# Patient Record
Sex: Female | Born: 1965 | Race: White | Hispanic: No | Smoking: Current every day smoker
Health system: Southern US, Community
[De-identification: ages and names within clinical notes are randomized; demographics above are authoritative.]

## PROBLEM LIST (undated history)

## (undated) DIAGNOSIS — E119 Type 2 diabetes mellitus without complications: Secondary | ICD-10-CM

## (undated) DIAGNOSIS — I10 Essential (primary) hypertension: Secondary | ICD-10-CM

## (undated) HISTORY — PX: TONSILLECTOMY: SUR1361

## (undated) HISTORY — PX: NASAL SEPTUM SURGERY: SHX37

## (undated) HISTORY — PX: PARTIAL HYSTERECTOMY: SHX80

## (undated) HISTORY — PX: APPENDECTOMY: SHX54

## (undated) HISTORY — PX: ABDOMINAL HYSTERECTOMY: SHX81

## (undated) HISTORY — PX: CHOLECYSTECTOMY: SHX55

---

## 2015-08-19 ENCOUNTER — Emergency Department
Admission: EM | Admit: 2015-08-19 | Discharge: 2015-08-19 | Disposition: A | Discharge status: Home / Self Care | Payer: Self-pay | Attending: Emergency Medicine | Admitting: Emergency Medicine

## 2015-08-19 ENCOUNTER — Encounter: Payer: Self-pay | Admitting: Emergency Medicine

## 2015-08-19 DIAGNOSIS — E119 Type 2 diabetes mellitus without complications: Secondary | ICD-10-CM | POA: Insufficient documentation

## 2015-08-19 DIAGNOSIS — I10 Essential (primary) hypertension: Secondary | ICD-10-CM | POA: Insufficient documentation

## 2015-08-19 DIAGNOSIS — F17210 Nicotine dependence, cigarettes, uncomplicated: Secondary | ICD-10-CM | POA: Insufficient documentation

## 2015-08-19 DIAGNOSIS — H109 Unspecified conjunctivitis: Secondary | ICD-10-CM | POA: Insufficient documentation

## 2015-08-19 DIAGNOSIS — J301 Allergic rhinitis due to pollen: Secondary | ICD-10-CM | POA: Insufficient documentation

## 2015-08-19 DIAGNOSIS — Z760 Encounter for issue of repeat prescription: Secondary | ICD-10-CM | POA: Insufficient documentation

## 2015-08-19 HISTORY — DX: Essential (primary) hypertension: I10

## 2015-08-19 HISTORY — DX: Type 2 diabetes mellitus without complications: E11.9

## 2015-08-19 MED ORDER — POLYMYXIN B-TRIMETHOPRIM 10000-0.1 UNIT/ML-% OP SOLN: 1.0000 [drp] | Freq: Four times a day (QID) | OPHTHALMIC | Status: DC

## 2015-08-19 MED ORDER — LORATADINE 10 MG PO TABS: 10.0000 mg | ORAL_TABLET | Freq: Every day | ORAL | Status: DC

## 2015-08-19 MED ORDER — LISINOPRIL 10 MG PO TABS: 10.0000 mg | ORAL_TABLET | Freq: Every day | ORAL | Status: DC

## 2015-08-19 NOTE — ED Notes (Signed)
Pt requesting refill of 10mg  lisinopril. PA at bedside.

## 2015-08-19 NOTE — ED Notes (Signed)
Pt c/o redness, watery discharge, and "gunk in the morning" x 3 days. Pt presents with redness to both eyes at this time.

## 2015-08-19 NOTE — Discharge Instructions (Signed)
Allergic Rhinitis Allergic rhinitis is when the mucous membranes in the nose respond to allergens. Allergens are particles in the air that cause your body to have an allergic reaction. This causes you to release allergic antibodies. Through a chain of events, these eventually cause you to release histamine into the blood stream. Although meant to protect the body, it is this release of histamine that causes your discomfort, such as frequent sneezing, congestion, and an itchy, runny nose.  CAUSES Seasonal allergic rhinitis (hay fever) is caused by pollen allergens that may come from grasses, trees, and weeds. Year-round allergic rhinitis (perennial allergic rhinitis) is caused by allergens such as house dust mites, pet dander, and mold spores. SYMPTOMS  Nasal stuffiness (congestion).  Itchy, runny nose with sneezing and tearing of the eyes. DIAGNOSIS Your health care provider can help you determine the allergen or allergens that trigger your symptoms. If you and your health care provider are unable to determine the allergen, skin or blood testing may be used. Your health care provider will diagnose your condition after taking your health history and performing a physical exam. Your health care provider may assess you for other related conditions, such as asthma, pink eye, or an ear infection. TREATMENT Allergic rhinitis does not have a cure, but it can be controlled by:  Medicines that block allergy symptoms. These may include allergy shots, nasal sprays, and oral antihistamines.  Avoiding the allergen. Hay fever may often be treated with antihistamines in pill or nasal spray forms. Antihistamines block the effects of histamine. There are over-the-counter medicines that may help with nasal congestion and swelling around the eyes. Check with your health care provider before taking or giving this medicine. If avoiding the allergen or the medicine prescribed do not work, there are many new medicines  your health care provider can prescribe. Stronger medicine may be used if initial measures are ineffective. Desensitizing injections can be used if medicine and avoidance does not work. Desensitization is when a patient is given ongoing shots until the body becomes less sensitive to the allergen. Make sure you follow up with your health care provider if problems continue. HOME CARE INSTRUCTIONS It is not possible to completely avoid allergens, but you can reduce your symptoms by taking steps to limit your exposure to them. It helps to know exactly what you are allergic to so that you can avoid your specific triggers. SEEK MEDICAL CARE IF:  You have a fever.  You develop a cough that does not stop easily (persistent).  You have shortness of breath.  You start wheezing.  Symptoms interfere with normal daily activities.   This information is not intended to replace advice given to you by your health care provider. Make sure you discuss any questions you have with your health care provider.   Document Released: 01/01/2001 Document Revised: 04/29/2014 Document Reviewed: 12/14/2012 Elsevier Interactive Patient Education 2016 Elsevier Inc.  Bacterial Conjunctivitis Bacterial conjunctivitis, commonly called pink eye, is an inflammation of the clear membrane that covers the white part of the eye (conjunctiva). The inflammation can also happen on the underside of the eyelids. The blood vessels in the conjunctiva become inflamed, causing the eye to become red or pink. Bacterial conjunctivitis may spread easily from one eye to another and from person to person (contagious).  CAUSES  Bacterial conjunctivitis is caused by bacteria. The bacteria may come from your own skin, your upper respiratory tract, or from someone else with bacterial conjunctivitis. SYMPTOMS  The normally white color of  the eye or the underside of the eyelid is usually pink or red. The pink eye is usually associated with irritation,  tearing, and some sensitivity to light. Bacterial conjunctivitis is often associated with a thick, yellowish discharge from the eye. The discharge may turn into a crust on the eyelids overnight, which causes your eyelids to stick together. If a discharge is present, there may also be some blurred vision in the affected eye. DIAGNOSIS  Bacterial conjunctivitis is diagnosed by your caregiver through an eye exam and the symptoms that you report. Your caregiver looks for changes in the surface tissues of your eyes, which may point to the specific type of conjunctivitis. A sample of any discharge may be collected on a cotton-tip swab if you have a severe case of conjunctivitis, if your cornea is affected, or if you keep getting repeat infections that do not respond to treatment. The sample will be sent to a lab to see if the inflammation is caused by a bacterial infection and to see if the infection will respond to antibiotic medicines. TREATMENT   Bacterial conjunctivitis is treated with antibiotics. Antibiotic eyedrops are most often used. However, antibiotic ointments are also available. Antibiotics pills are sometimes used. Artificial tears or eye washes may ease discomfort. HOME CARE INSTRUCTIONS   To ease discomfort, apply a cool, clean washcloth to your eye for 10-20 minutes, 3-4 times a day.  Gently wipe away any drainage from your eye with a warm, wet washcloth or a cotton ball.  Wash your hands often with soap and water. Use paper towels to dry your hands.  Do not share towels or washcloths. This may spread the infection.  Change or wash your pillowcase every day.  You should not use eye makeup until the infection is gone.  Do not operate machinery or drive if your vision is blurred.  Stop using contact lenses. Ask your caregiver how to sterilize or replace your contacts before using them again. This depends on the type of contact lenses that you use.  When applying medicine to the  infected eye, do not touch the edge of your eyelid with the eyedrop bottle or ointment tube. SEEK IMMEDIATE MEDICAL CARE IF:   Your infection has not improved within 3 days after beginning treatment.  You had yellow discharge from your eye and it returns.  You have increased eye pain.  Your eye redness is spreading.  Your vision becomes blurred.  You have a fever or persistent symptoms for more than 2-3 days.  You have a fever and your symptoms suddenly get worse.  You have facial pain, redness, or swelling. MAKE SURE YOU:   Understand these instructions.  Will watch your condition.  Will get help right away if you are not doing well or get worse.   This information is not intended to replace advice given to you by your health care provider. Make sure you discuss any questions you have with your health care provider.   Document Released: 04/08/2005 Document Revised: 04/29/2014 Document Reviewed: 09/09/2011 Elsevier Interactive Patient Education 2016 Elsevier Inc.   DASH Eating Plan  DASH stands for "Dietary Approaches to Stop Hypertension." The DASH eating plan is a healthy eating plan that has been shown to reduce high blood pressure (hypertension). Additional health benefits may include reducing the risk of type 2 diabetes mellitus, heart disease, and stroke. The DASH eating plan may also help with weight loss.  WHAT DO I NEED TO KNOW ABOUT THE DASH EATING PLAN?  For the DASH eating plan, you will follow these general guidelines:  Choose foods with a percent daily value for sodium of less than 5% (as listed on the food label).  Use salt-free seasonings or herbs instead of table salt or sea salt.  Check with your health care provider or pharmacist before using salt substitutes.  Eat lower-sodium products, often labeled as "lower sodium" or "no salt added."  Eat fresh foods.  Eat more vegetables, fruits, and low-fat dairy products.  Choose whole grains. Look for the word  "whole" as the first word in the ingredient list.  Choose fish and skinless chicken or [place] more often than red meat. Limit fish, poultry, and meat to 6 oz (170 g) each day.  Limit sweets, desserts, sugars, and sugary drinks.  Choose heart-healthy fats.  Limit cheese to 1 oz (28 g) per day.  Eat more home-cooked food and less restaurant, buffet, and fast food.  Limit fried foods.  Cook foods using methods other than frying.  Limit canned vegetables. If you do use them, rinse them well to decrease the sodium.  When eating at a restaurant, ask that your food be prepared with less salt, or no salt if possible. WHAT FOODS CAN I EAT?  Seek help from a dietitian for individual calorie needs.  Grains  Whole grain or whole wheat bread. Brown rice. Whole grain or whole wheat pasta. Quinoa, bulgur, and whole grain cereals. Low-sodium cereals. Corn or whole wheat flour tortillas. Whole grain cornbread. Whole grain crackers. Low-sodium crackers.  Vegetables  Fresh or frozen vegetables (raw, steamed, roasted, or grilled). Low-sodium or reduced-sodium tomato and vegetable juices. Low-sodium or reduced-sodium tomato sauce and paste. Low-sodium or reduced-sodium canned vegetables.  Fruits  All fresh, canned (in natural juice), or frozen fruits.  Meat and Other Protein Products  Ground beef (85% or leaner), grass-fed beef, or beef trimmed of fat. Skinless chicken or [place]. Ground chicken or [place]. Pork trimmed of fat. All fish and seafood. Eggs. Dried beans, peas, or lentils. Unsalted nuts and seeds. Unsalted canned beans.  Dairy  Low-fat dairy products, such as skim or 1% milk, 2% or reduced-fat cheeses, low-fat ricotta or cottage cheese, or plain low-fat yogurt. Low-sodium or reduced-sodium cheeses.  Fats and Oils  Tub margarines without trans fats. Light or reduced-fat mayonnaise and salad dressings (reduced sodium). Avocado. Safflower, olive, or canola oils. Natural peanut or almond butter.  Other    Unsalted popcorn and pretzels.  The items listed above may not be a complete list of recommended foods or beverages. Contact your dietitian for more options.  WHAT FOODS ARE NOT RECOMMENDED?  Grains  White bread. White pasta. White rice. Refined cornbread. Bagels and croissants. Crackers that contain trans fat.  Vegetables  Creamed or fried vegetables. Vegetables in a cheese sauce. Regular canned vegetables. Regular canned tomato sauce and paste. Regular tomato and vegetable juices.  Fruits  Dried fruits. Canned fruit in light or heavy syrup. Fruit juice.  Meat and Other Protein Products  Fatty cuts of meat. Ribs, chicken wings, bacon, sausage, bologna, salami, chitterlings, fatback, hot dogs, bratwurst, and packaged luncheon meats. Salted nuts and seeds. Canned beans with salt.  Dairy  Whole or 2% milk, cream, half-and-half, and cream cheese. Whole-fat or sweetened yogurt. Full-fat cheeses or blue cheese. Nondairy creamers and whipped toppings. Processed cheese, cheese spreads, or cheese curds.  Condiments  Onion and garlic salt, seasoned salt, table salt, and sea salt. Canned and packaged gravies. Worcestershire sauce. Tartar sauce. Barbecue sauce.  Teriyaki sauce. Soy sauce, including reduced sodium. Steak sauce. Fish sauce. Oyster sauce. Cocktail sauce. Horseradish. Ketchup and mustard. Meat flavorings and tenderizers. Bouillon cubes. Hot sauce. Tabasco sauce. Marinades. Taco seasonings. Relishes.  Fats and Oils  Butter, stick margarine, lard, shortening, ghee, and bacon fat. Coconut, palm kernel, or palm oils. Regular salad dressings.  Other  Pickles and olives. Salted popcorn and pretzels.  The items listed above may not be a complete list of foods and beverages to avoid. Contact your dietitian for more information.  WHERE CAN I FIND MORE INFORMATION?  National [employer], [employer], [employer]: [employer]  This information is not intended to  replace advice given to you by your health care provider. Make sure you discuss any questions you have with your health care provider.  Document Released: 03/28/2011 Document Revised: 04/29/2014 Document Reviewed: 02/10/2013  Elsevier Interactive Patient Education [employer]   Medicine Refill at the Emergency Department  We have refilled your medicine today, but it is best for you to get refills through your primary health care provider's office. In the future, please plan ahead so you do not need to get refills from the emergency department.  If the medicine we refilled was a maintenance medicine, you may have received only enough to get you by until you are able to see your regular health care provider.  This information is not intended to replace advice given to you by your health care provider. Make sure you discuss any questions you have with your health care provider.  Document Released: 07/26/2003 Document Revised: 04/29/2014 Document Reviewed: 07/16/2013  Elsevier Interactive Patient Education [employer]

## 2015-08-19 NOTE — ED Notes (Signed)
Discussed discharge instructions, prescriptions, and follow-up care with patient. No questions or concerns at this time. Pt stable at discharge.  

## 2015-08-19 NOTE — ED Provider Notes (Signed)
[facility] Emergency Department Provider Note  ____________________________________________  Time seen: Approximately 3:04 PM  I have reviewed the triage vital signs and the nursing notes.   HISTORY  Chief Complaint Conjunctivitis    HPI Valerie Cross is a 50 y.o. female , NAD, presents to the emergency department with 3 day history of bilateral eye redness, watery discharge and purulent discharge. States she has a history of allergic rhinitis but has not been taking her Zyrtec for approximately 1 week. His knee to the area from [place] and has not had allergies as bad as she has noted since moving here. Notes some pressure behind the eyes along with nasal congestion, profuse postnasal drip, cough and chest congestion. Has not had any fevers, chills, fatigue. Also notes she is out of her antihypertensive medication. She is requesting refill of lisinopril. Denies chest pain, shortness of breath, visual changes, abdominal pain, nausea, diaphoresis, headaches.   Past Medical History  Diagnosis Date  . Hypertension   . Diabetes mellitus without complication (HCC)     There are no active problems to display for this patient.   Past Surgical History  Procedure Laterality Date  . Partial hysterectomy    . Appendectomy    . Cholecystectomy    . Nasal septum surgery    . Tonsillectomy      Current Outpatient Rx  Name  Route  Sig  Dispense  Refill  . lisinopril (PRINIVIL,ZESTRIL) 10 MG tablet   Oral   Take 1 tablet (10 mg total) by mouth daily.   30 tablet   0   . loratadine (CLARITIN) 10 MG tablet   Oral   Take 1 tablet (10 mg total) by mouth daily.   30 tablet   0   . trimethoprim-polymyxin b (POLYTRIM) ophthalmic solution   Both Eyes   Place 1 drop into both eyes every 6 (six) hours.   10 mL   0     Allergies Tylenol  No family history on file.  Social History Social History  Substance Use Topics  . Smoking status: Current Every Day  Smoker -- 1.00 packs/day    Types: Cigarettes  . Smokeless tobacco: None  . Alcohol Use: No     Review of Systems  Constitutional: No fever/chills, fatigue Eyes: Positive bilateral eye erythema, purulent discharge, watery discharge and swelling with pressure. No visual changes, pain.  ENT: Positive nasal congestion, runny nose, sneezing. No sore throat or ear pain. Cardiovascular: No chest pain, palpitations. Respiratory: Positive chest congestion, cough. No shortness of breath. No wheezing.  Gastrointestinal: No abdominal pain.  No nausea, vomiting. Musculoskeletal: Negative for general myalgias.  Skin: Negative for rash. Neurological: Negative for headaches, focal weakness or numbness. 10-point ROS otherwise negative.  ____________________________________________   PHYSICAL EXAM:  VITAL SIGNS: ED Triage Vitals  Enc Vitals Group     BP 08/19/15 1344 128/74 mmHg     Pulse Rate 08/19/15 1344 114     Resp 08/19/15 1344 18     Temp 08/19/15 1344 98.1 F (36.7 C)     Temp Source 08/19/15 1344 Oral     SpO2 08/19/15 1344 95 %     Weight 08/19/15 1344 190 lb (86.183 kg)     Height 08/19/15 1344 5\' 6"  (1.676 m)     Head Cir --      Peak Flow --      Pain Score 08/19/15 1345 4     Pain Loc --  Pain Edu? --      Excl. in GC? --      Constitutional: Alert and oriented. Well appearing and in no acute distress. Eyes: Bilateral conjunctiva with mild erythema. Right with mild, purulent, green discharge. Left eye with trace green discharge. No swelling or redness noted about the eyelids. Mild yellow crusting about the upper and lower eyelids of the right eye.Marland Kitchen PERRL. EOMI without pain.  Head: Atraumatic. ENT:      Ears: TMs visualized bilaterally without effusion, bulging, perforation, erythema.      Nose: Mild congestion with trace clear rhinnorhea. Turbinates are injected.      Mouth/Throat: Mucous membranes are moist.  Neck: Supple with full range of  motion. Hematological/Lymphatic/Immunilogical: No cervical lymphadenopathy. Cardiovascular: Normal rate, regular rhythm. Normal S1 and S2.   Respiratory: Normal respiratory effort without tachypnea or retractions. Lungs CTAB with breath sounds noted in all lung fields. Neurologic:  Normal speech and language. No gross focal neurologic deficits are appreciated.  Skin:  Skin is warm, dry and intact. No rash noted. Psychiatric: Mood and affect are normal. Speech and behavior are normal. Patient exhibits appropriate insight and judgement.   ____________________________________________   LABS  None  ____________________________________________  EKG  None ____________________________________________  RADIOLOGY  None ____________________________________________    PROCEDURES  Procedure(s) performed: None    Medications - No data to display   ____________________________________________   INITIAL IMPRESSION / ASSESSMENT AND PLAN / ED COURSE  Patient's diagnosis is consistent with bilateral conjunctivitis due to allergic rhinitis. Patient will be discharged home with prescriptions for Polytrim ophthalmic drops and loratadine to take as directed. Patient also requested refill of lisinopril to control hypertension which I gave her a 30 day supply prescription. Patient is to follow up with the [facility] if symptoms persist past this treatment course and to establish care. Patient is given ED precautions to return to the ED for any worsening or new symptoms.      ____________________________________________  FINAL CLINICAL IMPRESSION(S) / ED DIAGNOSES  Final diagnoses:  Bilateral conjunctivitis  Allergic rhinitis due to pollen  Essential hypertension  Medication refill      NEW MEDICATIONS STARTED DURING THIS VISIT:  New Prescriptions   LISINOPRIL (PRINIVIL,ZESTRIL) 10 MG TABLET    Take 1 tablet (10 mg total) by mouth daily.   LORATADINE  (CLARITIN) 10 MG TABLET    Take 1 tablet (10 mg total) by mouth daily.   TRIMETHOPRIM-POLYMYXIN B (POLYTRIM) OPHTHALMIC SOLUTION    Place 1 drop into both eyes every 6 (six) hours.         Hope Pigeon, PA-C 08/19/15 1527  Minna Antis, MD 08/20/15 [phone]

## 2015-09-17 ENCOUNTER — Emergency Department
Admission: EM | Admit: 2015-09-17 | Discharge: 2015-09-17 | Disposition: A | Discharge status: Home / Self Care | Payer: Self-pay | Attending: Emergency Medicine | Admitting: Emergency Medicine

## 2015-09-17 ENCOUNTER — Emergency Department: Payer: Self-pay

## 2015-09-17 ENCOUNTER — Encounter: Payer: Self-pay | Admitting: Emergency Medicine

## 2015-09-17 DIAGNOSIS — I10 Essential (primary) hypertension: Secondary | ICD-10-CM | POA: Insufficient documentation

## 2015-09-17 DIAGNOSIS — F17210 Nicotine dependence, cigarettes, uncomplicated: Secondary | ICD-10-CM | POA: Insufficient documentation

## 2015-09-17 DIAGNOSIS — Y929 Unspecified place or not applicable: Secondary | ICD-10-CM | POA: Insufficient documentation

## 2015-09-17 DIAGNOSIS — E119 Type 2 diabetes mellitus without complications: Secondary | ICD-10-CM | POA: Insufficient documentation

## 2015-09-17 DIAGNOSIS — Z760 Encounter for issue of repeat prescription: Secondary | ICD-10-CM | POA: Insufficient documentation

## 2015-09-17 DIAGNOSIS — W51XXXA Accidental striking against or bumped into by another person, initial encounter: Secondary | ICD-10-CM | POA: Insufficient documentation

## 2015-09-17 DIAGNOSIS — W19XXXA Unspecified fall, initial encounter: Secondary | ICD-10-CM

## 2015-09-17 DIAGNOSIS — Y939 Activity, unspecified: Secondary | ICD-10-CM | POA: Insufficient documentation

## 2015-09-17 DIAGNOSIS — S20211A Contusion of right front wall of thorax, initial encounter: Secondary | ICD-10-CM | POA: Insufficient documentation

## 2015-09-17 DIAGNOSIS — M542 Cervicalgia: Secondary | ICD-10-CM | POA: Insufficient documentation

## 2015-09-17 DIAGNOSIS — S0083XA Contusion of other part of head, initial encounter: Secondary | ICD-10-CM | POA: Insufficient documentation

## 2015-09-17 DIAGNOSIS — Y999 Unspecified external cause status: Secondary | ICD-10-CM | POA: Insufficient documentation

## 2015-09-17 MED ORDER — LISINOPRIL 10 MG PO TABS: 10.0000 mg | ORAL_TABLET | Freq: Every day | ORAL | Status: DC

## 2015-09-17 MED ORDER — OXYCODONE HCL ER 10 MG PO T12A
10.0000 mg | EXTENDED_RELEASE_TABLET | Freq: Once | ORAL | Status: AC
  Administered 2015-09-17: 10 mg via ORAL
  Filled 2015-09-17: qty 1

## 2015-09-17 MED ORDER — OXYCODONE HCL 5 MG PO TABS: 5.0000 mg | ORAL_TABLET | Freq: Three times a day (TID) | ORAL | Status: DC | PRN

## 2015-09-17 MED ORDER — MELOXICAM 15 MG PO TABS: 15.0000 mg | ORAL_TABLET | Freq: Every day | ORAL | Status: DC

## 2015-09-17 NOTE — ED Notes (Addendum)
Pt to ED via POV for fall x2days ago, pt hit head, denies LOC, has hematoma to the Rt side of head, pt c/o increased pain to head and left rib cage area with painful inhalation and desire to cough more. Pt A&O

## 2015-09-17 NOTE — Discharge Instructions (Signed)
Blunt Chest Trauma Blunt chest trauma is an injury caused by a blow to the chest. These chest injuries can be very painful. Blunt chest trauma often results in bruised or broken (fractured) ribs. Most cases of bruised and fractured ribs from blunt chest traumas get better after 1 to 3 weeks of rest and pain medicine. Often, the soft tissue in the chest wall is also injured, causing pain and bruising. Internal organs, such as the heart and lungs, may also be injured. Blunt chest trauma can lead to serious medical problems. This injury requires immediate medical care. CAUSES   Motor vehicle collisions.  Falls.  Physical violence.  Sports injuries. SYMPTOMS   Chest pain. The pain may be worse when you move or breathe deeply.  Shortness of breath.  Lightheadedness.  Bruising.  Tenderness.  Swelling. DIAGNOSIS  Your caregiver will do a physical exam. X-rays may be taken to look for fractures. However, minor rib fractures may not show up on X-rays until a few days after the injury. If a more serious injury is suspected, further imaging tests may be done. This may include ultrasounds, computed tomography (CT) scans, or magnetic resonance imaging (MRI). TREATMENT  Treatment depends on the severity of your injury. Your caregiver may prescribe pain medicines and deep breathing exercises. HOME CARE INSTRUCTIONS  Limit your activities until you can move around without much pain.  Do not do any strenuous work until your injury is healed.  Put ice on the injured area.  Put ice in a plastic bag.  Place a towel between your skin and the bag.  Leave the ice on for 15-20 minutes, 03-04 times a day.  You may wear a rib belt as directed by your caregiver to reduce pain.  Practice deep breathing as directed by your caregiver to keep your lungs clear.  Only take over-the-counter or prescription medicines for pain, fever, or discomfort as directed by your caregiver. SEEK IMMEDIATE MEDICAL  CARE IF:   You have increasing pain or shortness of breath.  You cough up blood.  You have nausea, vomiting, or abdominal pain.  You have a fever.  You feel dizzy, weak, or you faint. MAKE SURE YOU:  Understand these instructions.  Will watch your condition.  Will get help right away if you are not doing well or get worse.   This information is not intended to replace advice given to you by your health care provider. Make sure you discuss any questions you have with your health care provider.   Document Released: 05/16/2004 Document Revised: 04/29/2014 Document Reviewed: 10/05/2014 Elsevier Interactive Patient Education 2016 [employer]  Facial or Scalp Contusion A facial or scalp contusion is a deep bruise on the face or head. Injuries to the face and head generally cause a lot of swelling, especially around the eyes. Contusions are the result of an injury that caused bleeding under the skin. The contusion may turn blue, purple, or yellow. Minor injuries will give you a painless contusion, but more severe contusions may stay painful and swollen for a few weeks.  CAUSES  A facial or scalp contusion is caused by a blunt injury or trauma to the face or head area.  SIGNS AND SYMPTOMS   Swelling of the injured area.   Discoloration of the injured area.   Tenderness, soreness, or pain in the injured area.  DIAGNOSIS  The diagnosis can be made by taking a medical history and doing a physical exam. An X-ray exam, CT scan, or MRI  may be needed to determine if there are any associated injuries, such as broken bones (fractures). TREATMENT  Often, the best treatment for a facial or scalp contusion is applying cold compresses to the injured area. Over-the-counter medicines may also be recommended for pain control.  HOME CARE INSTRUCTIONS   Only take over-the-counter or prescription medicines as directed by your health care provider.   Apply ice to the injured area.   Put ice  in a plastic bag.   Place a towel between your skin and the bag.   Leave the ice on for 20 minutes, 2-3 times a day.  SEEK MEDICAL CARE IF:  You have bite problems.   You have pain with chewing.   You are concerned about facial defects. SEEK IMMEDIATE MEDICAL CARE IF:  You have severe pain or a headache that is not relieved by medicine.   You have unusual sleepiness, confusion, or personality changes.   You throw up (vomit).   You have a persistent nosebleed.   You have double vision or blurred vision.   You have fluid drainage from your nose or ear.   You have difficulty walking or using your arms or legs.  MAKE SURE YOU:   Understand these instructions.  Will watch your condition.  Will get help right away if you are not doing well or get worse.   This information is not intended to replace advice given to you by your health care provider. Make sure you discuss any questions you have with your health care provider.   Document Released: 05/16/2004 Document Revised: 04/29/2014 Document Reviewed: 11/19/2012 Elsevier Interactive Patient Education 2016 [employer]  Rib Contusion A rib contusion is a deep bruise on your rib area. Contusions are the result of a blunt trauma that causes bleeding and injury to the tissues under the skin. A rib contusion may involve bruising of the ribs and of the skin and muscles in the area. The skin overlying the contusion may turn blue, purple, or yellow. Minor injuries will give you a painless contusion, but more severe contusions may stay painful and swollen for a few weeks. CAUSES  A contusion is usually caused by a blow, trauma, or direct force to an area of the body. This often occurs while playing contact sports. SYMPTOMS  Swelling and redness of the injured area.  Discoloration of the injured area.  Tenderness and soreness of the injured area.  Pain with or without movement. DIAGNOSIS  The diagnosis can be made  by taking a medical history and performing a physical exam. An X-ray, CT scan, or MRI may be needed to determine if there were any associated injuries, such as broken bones (fractures) or internal injuries. TREATMENT  Often, the best treatment for a rib contusion is rest. Icing or applying cold compresses to the injured area may help reduce swelling and inflammation. Deep breathing exercises may be recommended to reduce the risk of partial lung collapse and pneumonia. Over-the-counter or prescription medicines may also be recommended for pain control. HOME CARE INSTRUCTIONS   Apply ice to the injured area:  Put ice in a plastic bag.  Place a towel between your skin and the bag.  Leave the ice on for 20 minutes, 2-3 times per day.  Take medicines only as directed by your health care provider.  Rest the injured area. Avoid strenuous activity and any activities or movements that cause pain. Be careful during activities and avoid bumping the injured area.  Perform deep-breathing exercises as  directed by your health care provider.  Do not lift anything that is heavier than 5 lb (2.3 kg) until your health care provider approves.  Do not use any tobacco products, including cigarettes, chewing tobacco, or electronic cigarettes. If you need help quitting, ask your health care provider. SEEK MEDICAL CARE IF:   You have increased bruising or swelling.  You have pain that is not controlled with treatment.  You have a fever. SEEK IMMEDIATE MEDICAL CARE IF:   You have difficulty breathing or shortness of breath.  You develop a continual cough, or you cough up thick or bloody sputum.  You feel sick to your stomach (nauseous), you throw up (vomit), or you have abdominal pain.   This information is not intended to replace advice given to you by your health care provider. Make sure you discuss any questions you have with your health care provider.   Document Released: 01/01/2001 Document  Revised: 04/29/2014 Document Reviewed: 01/18/2014 Elsevier Interactive Patient Education [employer] Inc[employer]

## 2015-09-17 NOTE — ED Provider Notes (Signed)
[facility] Emergency Department Provider Note  ____________________________________________  Time seen: Approximately 7:23 PM  I have reviewed the triage vital signs and the nursing notes.   HISTORY  Chief Complaint Fall    HPI Valerie Cross is a 50 y.o. female who presents emergency department status post fall 2 days ago. Patient states that her fall she did hit her head. She became very dizzy and lightheaded at the time but did not pass out. Patient states that she has a large "knot" to the posterior left side of her head. Patient endorses a headache from time of the event. Patient also endorses neck pain. Patient's complaining of left-sided rib pain that is sharp, severe, worse with deep inhalation. She states that she has felt moderately short of breath and feels an urge to cough. Patientdenies difficulty taking in a deep breath. Patient does not endorse having a productive cough. No other injury at this time. No other complaint.   Past Medical History  Diagnosis Date  . Hypertension   . Diabetes mellitus without complication (HCC)     There are no active problems to display for this patient.   Past Surgical History  Procedure Laterality Date  . Partial hysterectomy    . Appendectomy    . Cholecystectomy    . Nasal septum surgery    . Tonsillectomy      Current Outpatient Rx  Name  Route  Sig  Dispense  Refill  . lisinopril (PRINIVIL,ZESTRIL) 10 MG tablet   Oral   Take 1 tablet (10 mg total) by mouth daily.   30 tablet   0   . loratadine (CLARITIN) 10 MG tablet   Oral   Take 1 tablet (10 mg total) by mouth daily.   30 tablet   0   . meloxicam (MOBIC) 15 MG tablet   Oral   Take 1 tablet (15 mg total) by mouth daily.   30 tablet   0   . oxyCODONE (ROXICODONE) 5 MG immediate release tablet   Oral   Take 1 tablet (5 mg total) by mouth every 8 (eight) hours as needed.   20 tablet   0   . trimethoprim-polymyxin b (POLYTRIM)  ophthalmic solution   Both Eyes   Place 1 drop into both eyes every 6 (six) hours.   10 mL   0     Allergies Tylenol  No family history on file.  Social History Social History  Substance Use Topics  . Smoking status: Current Every Day Smoker -- 1.00 packs/day    Types: Cigarettes  . Smokeless tobacco: None  . Alcohol Use: No     Review of Systems  Constitutional: No fever/chills Eyes: No visual changes. Cardiovascular: no chest pain. Respiratory: Positive cough. Endorses mild SOB. Gastrointestinal: No abdominal pain.  No nausea, no vomiting.   Musculoskeletal: Positive for neck and left rib pain. Skin: Negative for rash, abrasions, lacerations, ecchymosis. Neurological: Also for headache but denies focal weakness or numbness. 10-point ROS otherwise negative.  ____________________________________________   PHYSICAL EXAM:  VITAL SIGNS: ED Triage Vitals  Enc Vitals Group     BP --      Pulse --      Resp --      Temp --      Temp src --      SpO2 --      Weight --      Height --      Head Cir --  Peak Flow --      Pain Score --      Pain Loc --      Pain Edu? --      Excl. in GC? --      Constitutional: Alert and oriented. Well appearing and in no acute distress. Eyes: Conjunctivae are normal. PERRL. EOMI. Head: Hematoma is noted to the left occipital region. No abrasions or lacerations are noted. Patient is tender to palpation over this area. No palpable abnormality. No crepitus. No tenderness to palpation of the bony landmarks of the skull. No battle signs. No raccoon eyes. serosanguineous fluid drainage from ears or nares. No signs of epistaxis. Neck: No stridor.  midline cervical spine tenderness to palpation.  Cardiovascular: Normal rate, regular rhythm. Normal S1 and S2.  Good peripheral circulation. Respiratory: Normal respiratory effort without tachypnea or retractions. Lungs CTAB. Good air entry to the bases with no decreased or absent breath  sounds. Musculoskeletal: Full range of motion to all extremities. No gross deformities appreciated. No visible deformity to the left side of rib cage. No ecchymosis noted. Patient is tender to palpation over the eighth through 12th ribs on the lateral aspect. No palpable abnormality. No flail segments. No paradoxical chest wall movement. Neurologic:  Normal speech and language. No gross focal neurologic deficits are appreciated. Cranial nerves II through XII are grossly intact. Skin:  Skin is warm, dry and intact. No rash noted. Psychiatric: Mood and affect are normal. Speech and behavior are normal. Patient exhibits appropriate insight and judgement.   ____________________________________________   LABS (all labs ordered are listed, but only abnormal results are displayed)  Labs Reviewed - No data to display ____________________________________________  EKG   ____________________________________________  RADIOLOGY Festus Barren Brittanie Dosanjh, personally viewed and evaluated these images (plain radiographs) as part of my medical decision making, as well as reviewing the written report by the radiologist.  Dg Ribs Unilateral W/chest Left  09/17/2015  CLINICAL DATA:  Anterior and lateral left axillary pain. EXAM: LEFT RIBS AND CHEST - 3+ VIEW COMPARISON:  None. FINDINGS: No fracture or other bone lesions are seen involving the ribs. There is no evidence of pneumothorax or pleural effusion. Both lungs are clear. Heart size and mediastinal contours are within normal limits. IMPRESSION: Negative. Electronically Signed   By: Elige Ko   On: 09/17/2015 19:53   Ct Head Wo Contrast  09/17/2015  CLINICAL DATA:  Larey Seat 2 days ago. Head injury. Headache and neck pain EXAM: CT HEAD WITHOUT CONTRAST CT CERVICAL SPINE WITHOUT CONTRAST TECHNIQUE: Multidetector CT imaging of the head and cervical spine was performed following the standard protocol without intravenous contrast. Multiplanar CT image  reconstructions of the cervical spine were also generated. COMPARISON:  None. FINDINGS: CT HEAD FINDINGS Ventricle size is normal. Negative for acute or chronic infarction. Negative for hemorrhage or fluid collection. Negative for mass or edema. No shift of the midline structures. Calvarium is intact. CT CERVICAL SPINE FINDINGS Normal alignment.  Mild cervical kyphosis 4 mm gas filled cyst superior C7 vertebral body most consistent with disc degeneration. There is also a small gas filled cyst in the medial right first rib compatible with degenerative change. No significant spurring or spinal stenosis. Negative for cervical spine fracture. Calcified granuloma left upper lobe. Mild apical emphysema. Right carotid artery calcification IMPRESSION: Negative CT of the head Negative for cervical spine fracture. Electronically Signed   By: Marlan Palau M.D.   On: 09/17/2015 20:07   Ct Cervical Spine Wo Contrast  09/17/2015  CLINICAL DATA:  Larey SeatFell 2 days ago. Head injury. Headache and neck pain EXAM: CT HEAD WITHOUT CONTRAST CT CERVICAL SPINE WITHOUT CONTRAST TECHNIQUE: Multidetector CT imaging of the head and cervical spine was performed following the standard protocol without intravenous contrast. Multiplanar CT image reconstructions of the cervical spine were also generated. COMPARISON:  None. FINDINGS: CT HEAD FINDINGS Ventricle size is normal. Negative for acute or chronic infarction. Negative for hemorrhage or fluid collection. Negative for mass or edema. No shift of the midline structures. Calvarium is intact. CT CERVICAL SPINE FINDINGS Normal alignment.  Mild cervical kyphosis 4 mm gas filled cyst superior C7 vertebral body most consistent with disc degeneration. There is also a small gas filled cyst in the medial right first rib compatible with degenerative change. No significant spurring or spinal stenosis. Negative for cervical spine fracture. Calcified granuloma left upper lobe. Mild apical emphysema. Right  carotid artery calcification IMPRESSION: Negative CT of the head Negative for cervical spine fracture. Electronically Signed   By: Marlan Palauharles  Clark M.D.   On: 09/17/2015 20:07    ____________________________________________    PROCEDURES  Procedure(s) performed:       Medications  oxyCODONE (OXYCONTIN) 12 hr tablet 10 mg (10 mg Oral Given 09/17/15 1935)     ____________________________________________   INITIAL IMPRESSION / ASSESSMENT AND PLAN / ED COURSE  Pertinent labs & imaging results that were available during my care of the patient were reviewed by me and considered in my medical decision making (see chart for details).  Patient's diagnosis is consistent with Fall resulting in hematoma of the occipital region, rib contusions. Patient is also requesting medication refill for her lisinopril. CT scan and x-ray revealed no acute intracranial or osseous abnormality. Exam is reassuring at this time. Patient will be discharged home with medications for symptom control. She will follow up primary care as needed... Patient is given ED precautions to return to the ED for any worsening or new symptoms.     ____________________________________________  FINAL CLINICAL IMPRESSION(S) / ED DIAGNOSES  Final diagnoses:  Fall, initial encounter  Hematoma of occipital surface of head, initial encounter  Rib contusion, right, initial encounter  Medication refill      NEW MEDICATIONS STARTED DURING THIS VISIT:  New Prescriptions   LISINOPRIL (PRINIVIL,ZESTRIL) 10 MG TABLET    Take 1 tablet (10 mg total) by mouth daily.   MELOXICAM (MOBIC) 15 MG TABLET    Take 1 tablet (15 mg total) by mouth daily.   OXYCODONE (ROXICODONE) 5 MG IMMEDIATE RELEASE TABLET    Take 1 tablet (5 mg total) by mouth every 8 (eight) hours as needed.        This chart was dictated using voice recognition software/Dragon. Despite best efforts to proofread, errors can occur which can change the meaning. Any  change was purely unintentional.    Racheal PatchesJonathan D Zania Kalisz, PA-C 09/17/15 2101  Myrna Blazeravid Matthew Schaevitz, MD 09/17/15 [phone]

## 2015-09-17 NOTE — ED Notes (Signed)
See triage note.

## 2016-05-21 ENCOUNTER — Emergency Department: Payer: Self-pay

## 2016-05-21 ENCOUNTER — Encounter: Payer: Self-pay | Admitting: Emergency Medicine

## 2016-05-21 ENCOUNTER — Emergency Department
Admission: EM | Admit: 2016-05-21 | Discharge: 2016-05-21 | Disposition: A | Discharge status: Home / Self Care | Payer: Self-pay | Attending: Emergency Medicine | Admitting: Emergency Medicine

## 2016-05-21 DIAGNOSIS — W19XXXA Unspecified fall, initial encounter: Secondary | ICD-10-CM

## 2016-05-21 DIAGNOSIS — T23222A Burn of second degree of single left finger (nail) except thumb, initial encounter: Secondary | ICD-10-CM | POA: Insufficient documentation

## 2016-05-21 DIAGNOSIS — I10 Essential (primary) hypertension: Secondary | ICD-10-CM | POA: Insufficient documentation

## 2016-05-21 DIAGNOSIS — Y9389 Activity, other specified: Secondary | ICD-10-CM | POA: Insufficient documentation

## 2016-05-21 DIAGNOSIS — X118XXA Contact with other hot tap-water, initial encounter: Secondary | ICD-10-CM | POA: Insufficient documentation

## 2016-05-21 DIAGNOSIS — R51 Headache: Secondary | ICD-10-CM | POA: Insufficient documentation

## 2016-05-21 DIAGNOSIS — S0003XA Contusion of scalp, initial encounter: Secondary | ICD-10-CM | POA: Insufficient documentation

## 2016-05-21 DIAGNOSIS — Z760 Encounter for issue of repeat prescription: Secondary | ICD-10-CM | POA: Insufficient documentation

## 2016-05-21 DIAGNOSIS — Y929 Unspecified place or not applicable: Secondary | ICD-10-CM | POA: Insufficient documentation

## 2016-05-21 DIAGNOSIS — E119 Type 2 diabetes mellitus without complications: Secondary | ICD-10-CM | POA: Insufficient documentation

## 2016-05-21 DIAGNOSIS — Z79899 Other long term (current) drug therapy: Secondary | ICD-10-CM | POA: Insufficient documentation

## 2016-05-21 DIAGNOSIS — F17210 Nicotine dependence, cigarettes, uncomplicated: Secondary | ICD-10-CM | POA: Insufficient documentation

## 2016-05-21 DIAGNOSIS — Y999 Unspecified external cause status: Secondary | ICD-10-CM | POA: Insufficient documentation

## 2016-05-21 MED ORDER — SILVER SULFADIAZINE 1 % EX CREA
TOPICAL_CREAM | Freq: Once | CUTANEOUS | Status: AC
  Administered 2016-05-21: 1 via TOPICAL

## 2016-05-21 MED ORDER — SILVER SULFADIAZINE 1 % EX CREA: TOPICAL_CREAM | CUTANEOUS | 0 refills | Status: DC

## 2016-05-21 MED ORDER — SILVER SULFADIAZINE 1 % EX CREA
TOPICAL_CREAM | CUTANEOUS | Status: AC
  Filled 2016-05-21: qty 85

## 2016-05-21 MED ORDER — LISINOPRIL 10 MG PO TABS: 10.0000 mg | ORAL_TABLET | Freq: Every day | ORAL | 1 refills | Status: DC

## 2016-05-21 MED ORDER — SULFAMETHOXAZOLE-TRIMETHOPRIM 800-160 MG PO TABS: 1.0000 | ORAL_TABLET | Freq: Two times a day (BID) | ORAL | 0 refills | Status: DC

## 2016-05-21 NOTE — ED Triage Notes (Signed)
Patient presents to the ED with painful forefinger x 4 days.  Patient states she had boiled water and accidentally poured "scalding hot water" on her hand.
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
## 2016-08-19 ENCOUNTER — Emergency Department: Payer: Self-pay

## 2016-08-19 ENCOUNTER — Encounter: Payer: Self-pay | Admitting: Emergency Medicine

## 2016-08-19 ENCOUNTER — Emergency Department
Admission: EM | Admit: 2016-08-19 | Discharge: 2016-08-19 | Disposition: A | Discharge status: Home / Self Care | Payer: Self-pay | Attending: Emergency Medicine | Admitting: Emergency Medicine

## 2016-08-19 DIAGNOSIS — F17210 Nicotine dependence, cigarettes, uncomplicated: Secondary | ICD-10-CM | POA: Insufficient documentation

## 2016-08-19 DIAGNOSIS — W1839XA Other fall on same level, initial encounter: Secondary | ICD-10-CM | POA: Insufficient documentation

## 2016-08-19 DIAGNOSIS — Y939 Activity, unspecified: Secondary | ICD-10-CM | POA: Insufficient documentation

## 2016-08-19 DIAGNOSIS — Z9114 Patient's other noncompliance with medication regimen: Secondary | ICD-10-CM | POA: Insufficient documentation

## 2016-08-19 DIAGNOSIS — Y929 Unspecified place or not applicable: Secondary | ICD-10-CM | POA: Insufficient documentation

## 2016-08-19 DIAGNOSIS — Z7984 Long term (current) use of oral hypoglycemic drugs: Secondary | ICD-10-CM | POA: Insufficient documentation

## 2016-08-19 DIAGNOSIS — Z79899 Other long term (current) drug therapy: Secondary | ICD-10-CM | POA: Insufficient documentation

## 2016-08-19 DIAGNOSIS — Y999 Unspecified external cause status: Secondary | ICD-10-CM | POA: Insufficient documentation

## 2016-08-19 DIAGNOSIS — S02641A Fracture of ramus of right mandible, initial encounter for closed fracture: Secondary | ICD-10-CM | POA: Insufficient documentation

## 2016-08-19 DIAGNOSIS — Z91199 Patient's noncompliance with other medical treatment and regimen due to unspecified reason: Secondary | ICD-10-CM

## 2016-08-19 DIAGNOSIS — E1165 Type 2 diabetes mellitus with hyperglycemia: Secondary | ICD-10-CM

## 2016-08-19 DIAGNOSIS — I10 Essential (primary) hypertension: Secondary | ICD-10-CM | POA: Insufficient documentation

## 2016-08-19 DIAGNOSIS — Z9119 Patient's noncompliance with other medical treatment and regimen: Secondary | ICD-10-CM

## 2016-08-19 DIAGNOSIS — E119 Type 2 diabetes mellitus without complications: Secondary | ICD-10-CM | POA: Insufficient documentation

## 2016-08-19 DIAGNOSIS — R040 Epistaxis: Secondary | ICD-10-CM | POA: Insufficient documentation

## 2016-08-19 LAB — GLUCOSE, CAPILLARY: GLUCOSE-CAPILLARY: 414 mg/dL — AB (ref 65–99)

## 2016-08-19 MED ORDER — TRAMADOL HCL 50 MG PO TABS: 50.0000 mg | ORAL_TABLET | Freq: Four times a day (QID) | ORAL | 0 refills | Status: AC | PRN

## 2016-08-19 MED ORDER — METFORMIN HCL 500 MG PO TABS
500.0000 mg | ORAL_TABLET | Freq: Once | ORAL | Status: AC
  Administered 2016-08-19: 500 mg via ORAL
  Filled 2016-08-19: qty 1

## 2016-08-19 MED ORDER — LISINOPRIL 10 MG PO TABS: 10.0000 mg | ORAL_TABLET | Freq: Every day | ORAL | 1 refills | Status: DC

## 2016-08-19 MED ORDER — CEPHALEXIN 500 MG PO CAPS: 500.0000 mg | ORAL_CAPSULE | Freq: Three times a day (TID) | ORAL | 0 refills | Status: DC

## 2016-08-19 MED ORDER — METFORMIN HCL 500 MG PO TABS: 500.0000 mg | ORAL_TABLET | Freq: Two times a day (BID) | ORAL | 1 refills | Status: DC

## 2016-08-19 NOTE — ED Notes (Signed)
spontaneous nose bleeds. Denies blood thinners. Not bleeding now.

## 2016-08-19 NOTE — Discharge Instructions (Signed)
[facility] your blood pressure and diabetes. You also continued to need blood pressure medication and metformin. Today you're blood sugar was 414. Also pay strict attention to your diabetes diet. Call [facility] for an appointment. The phone numbers listed on your discharge papers for follow-up of your fractured mandible. Eat soft foods and avoid chewing chewing gum. Keflex 500 mg 3 times a day for 10 days. Tramadol 1 every 6 hours as needed for pain. A prescription for metformin and lisinopril with one refill was given. [facility] can see [facility] for your medical problems.

## 2016-08-19 NOTE — ED Triage Notes (Signed)
Fell in February and hit head.  After fall patient describes multiple instances of epistaxis, especially with straining or coughing.  No current bleeding.
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
## 2016-08-19 NOTE — ED Triage Notes (Addendum)
Pt reports intermittent nose bleeds x2 months, reports falling last week and light purple bruising noted to right jaw.

## 2017-09-18 ENCOUNTER — Emergency Department: Payer: Self-pay

## 2017-09-18 ENCOUNTER — Emergency Department
Admission: EM | Admit: 2017-09-18 | Discharge: 2017-09-18 | Disposition: A | Discharge status: Home / Self Care | Payer: Self-pay | Attending: Student in an Organized Health Care Education/Training Program | Admitting: Student in an Organized Health Care Education/Training Program

## 2017-09-18 DIAGNOSIS — R600 Localized edema: Secondary | ICD-10-CM | POA: Insufficient documentation

## 2017-09-18 DIAGNOSIS — F17210 Nicotine dependence, cigarettes, uncomplicated: Secondary | ICD-10-CM | POA: Insufficient documentation

## 2017-09-18 DIAGNOSIS — M25562 Pain in left knee: Secondary | ICD-10-CM

## 2017-09-18 DIAGNOSIS — Z7984 Long term (current) use of oral hypoglycemic drugs: Secondary | ICD-10-CM | POA: Insufficient documentation

## 2017-09-18 DIAGNOSIS — M7989 Other specified soft tissue disorders: Secondary | ICD-10-CM | POA: Insufficient documentation

## 2017-09-18 DIAGNOSIS — E119 Type 2 diabetes mellitus without complications: Secondary | ICD-10-CM | POA: Insufficient documentation

## 2017-09-18 DIAGNOSIS — I10 Essential (primary) hypertension: Secondary | ICD-10-CM | POA: Insufficient documentation

## 2017-09-18 LAB — CBC
HCT: 44.4 % (ref 35.0–47.0)
Hemoglobin: 14.9 g/dL (ref 12.0–16.0)
MCH: 29.3 pg (ref 26.0–34.0)
MCHC: 33.5 g/dL (ref 32.0–36.0)
MCV: 87.5 fL (ref 80.0–100.0)
PLATELETS: 262 10*3/uL (ref 150–440)
RBC: 5.07 MIL/uL (ref 3.80–5.20)
RDW: 13.7 % (ref 11.5–14.5)
WBC: 10.2 10*3/uL (ref 3.6–11.0)

## 2017-09-18 LAB — COMPREHENSIVE METABOLIC PANEL
ALT: 28 U/L (ref 14–54)
AST: 19 U/L (ref 15–41)
Albumin: 3.7 g/dL (ref 3.5–5.0)
Alkaline Phosphatase: 153 U/L — ABNORMAL HIGH (ref 38–126)
Anion gap: 11 (ref 5–15)
BUN: 21 mg/dL — ABNORMAL HIGH (ref 6–20)
CHLORIDE: 96 mmol/L — AB (ref 101–111)
CO2: 27 mmol/L (ref 22–32)
CREATININE: 0.81 mg/dL (ref 0.44–1.00)
Calcium: 9.1 mg/dL (ref 8.9–10.3)
Glucose, Bld: 387 mg/dL — ABNORMAL HIGH (ref 65–99)
Potassium: 4.3 mmol/L (ref 3.5–5.1)
Sodium: 134 mmol/L — ABNORMAL LOW (ref 135–145)
Total Bilirubin: 0.6 mg/dL (ref 0.3–1.2)
Total Protein: 7.3 g/dL (ref 6.5–8.1)

## 2017-09-18 LAB — BRAIN NATRIURETIC PEPTIDE: B NATRIURETIC PEPTIDE 5: 29 pg/mL (ref 0.0–100.0)

## 2017-09-18 MED ORDER — FUROSEMIDE 20 MG PO TABS: 20.0000 mg | ORAL_TABLET | Freq: Every day | ORAL | 0 refills | Status: DC | PRN

## 2017-09-18 MED ORDER — LISINOPRIL 10 MG PO TABS: 10.0000 mg | ORAL_TABLET | Freq: Every day | ORAL | 1 refills | Status: AC

## 2017-09-18 MED ORDER — TRAMADOL HCL 50 MG PO TABS: 50.0000 mg | ORAL_TABLET | Freq: Four times a day (QID) | ORAL | 0 refills | Status: DC | PRN

## 2017-09-18 MED ORDER — TRAMADOL HCL 50 MG PO TABS
50.0000 mg | ORAL_TABLET | Freq: Once | ORAL | Status: AC
  Administered 2017-09-18: 50 mg via ORAL
  Filled 2017-09-18: qty 1

## 2017-09-18 NOTE — ED Triage Notes (Addendum)
Patient c/o lower left leg swelling. Patient reports increased swelling throughout the day. Patient reports last time she had this issue she took lasix and the problem resolved. Patient also needs a refill on BP medications. Patient also reports that she fell yesterday onto her left knee. Patient c/o left knee pain.

## 2017-09-18 NOTE — ED Provider Notes (Signed)
[facility] Emergency Department Provider Note    First MD Initiated Contact with Patient 09/18/17 2011     (approximate)  I have reviewed the triage vital signs and the nursing notes.   HISTORY  Chief Complaint Leg Swelling    HPI Valerie Cross is a 52 y.o. female history of diabetes and hypertension was previously on Lasix for peripheral edema presents to the ER with left leg swelling and pain.  Patient recently drove back from [place] and started having worsening swelling.  States that due to the swelling she was having worsening pain and actually fell hitting her left knee on the floor.  Denies any shortness of breath.  No measured fevers.  No rash.  Says the pain is mild to moderate.    Past Medical History:  Diagnosis Date  . Diabetes mellitus without complication (HCC)   . Hypertension    No family history on file. Past Surgical History:  Procedure Laterality Date  . ABDOMINAL HYSTERECTOMY    . APPENDECTOMY    . CHOLECYSTECTOMY    . NASAL SEPTUM SURGERY    . PARTIAL HYSTERECTOMY    . TONSILLECTOMY     There are no active problems to display for this patient.     Prior to Admission medications   Medication Sig Start Date End Date Taking? Authorizing Provider  cephALEXin (KEFLEX) 500 MG capsule Take 1 capsule (500 mg total) by mouth 3 (three) times daily. 08/19/16   Tommi Rumps, PA-C  furosemide (LASIX) 20 MG tablet Take 1 tablet (20 mg total) by mouth daily as needed for edema. 09/18/17 09/18/18  Willy Eddy, MD  lisinopril (PRINIVIL,ZESTRIL) 10 MG tablet Take 1 tablet (10 mg total) by mouth daily. 09/18/17 09/18/18  Willy Eddy, MD  metFORMIN (GLUCOPHAGE) 500 MG tablet Take 1 tablet (500 mg total) by mouth 2 (two) times daily with a meal. 08/19/16   Tommi Rumps, PA-C  traMADol (ULTRAM) 50 MG tablet Take 1 tablet (50 mg total) by mouth every 6 (six) hours as needed. 08/19/16   Tommi Rumps, PA-C  traMADol (ULTRAM) 50 MG  tablet Take 1 tablet (50 mg total) by mouth every 6 (six) hours as needed. 09/18/17 09/18/18  Willy Eddy, MD    Allergies Tylenol [acetaminophen]    Social History Social History   Tobacco Use  . Smoking status: Current Every Day Smoker    Packs/day: 1.00    Types: Cigarettes  . Smokeless tobacco: Never Used  Substance Use Topics  . Alcohol use: No  . Drug use: No    Review of Systems Patient denies headaches, rhinorrhea, blurry vision, numbness, shortness of breath, chest pain, edema, cough, abdominal pain, nausea, vomiting, diarrhea, dysuria, fevers, rashes or hallucinations unless otherwise stated above in HPI. ____________________________________________   PHYSICAL EXAM:  VITAL SIGNS: Vitals:   09/18/17 2205  BP: (!) 158/88  Pulse: 89  Resp: 18  Temp: 98 F (36.7 C)  SpO2: 99%    Constitutional: Alert and oriented.  Eyes: Conjunctivae are normal.  Head: Atraumatic. Nose: No congestion/rhinnorhea. Mouth/Throat: Mucous membranes are moist.   Neck: No stridor. Painless ROM.  Cardiovascular: Normal rate, regular rhythm. Grossly normal heart sounds.  Good peripheral circulation. Respiratory: Normal respiratory effort.  No retractions. Lungs CTAB. Gastrointestinal: Soft and nontender. No distention. No abdominal bruits. No CVA tenderness. Genitourinary: deferred Musculoskeletal: Left lower extremity 2+ pitting edema with minimal edema on the right there is a small area of contusion and ecchymosis to the  anterior left knee but no deformity.  No joint effusions. Neurologic:  Normal speech and language. No gross focal neurologic deficits are appreciated. No facial droop Skin:  Skin is warm, dry and intact. No rash noted. Psychiatric: Mood and affect are normal. Speech and behavior are normal.  ____________________________________________   LABS (all labs ordered are listed, but only abnormal results are displayed)  Results for orders placed or performed  during the hospital encounter of 09/18/17 (from the past 24 hour(s))  CBC     Status: None   Collection Time: 09/18/17  8:06 PM  Result Value Ref Range   WBC 10.2 3.6 - 11.0 K/uL   RBC 5.07 3.80 - 5.20 MIL/uL   Hemoglobin 14.9 12.0 - 16.0 g/dL   HCT 16.1 09.6 - 04.5 %   MCV 87.5 80.0 - 100.0 fL   MCH 29.3 26.0 - 34.0 pg   MCHC 33.5 32.0 - 36.0 g/dL   RDW 40.9 81.1 - 91.4 %   Platelets 262 150 - 440 K/uL  Comprehensive metabolic panel     Status: Abnormal   Collection Time: 09/18/17  8:06 PM  Result Value Ref Range   Sodium 134 (L) 135 - 145 mmol/L   Potassium 4.3 3.5 - 5.1 mmol/L   Chloride 96 (L) 101 - 111 mmol/L   CO2 27 22 - 32 mmol/L   Glucose, Bld 387 (H) 65 - 99 mg/dL   BUN 21 (H) 6 - 20 mg/dL   Creatinine, Ser 7.82 0.44 - 1.00 mg/dL   Calcium 9.1 8.9 - 95.6 mg/dL   Total Protein 7.3 6.5 - 8.1 g/dL   Albumin 3.7 3.5 - 5.0 g/dL   AST 19 15 - 41 U/L   ALT 28 14 - 54 U/L   Alkaline Phosphatase 153 (H) 38 - 126 U/L   Total Bilirubin 0.6 0.3 - 1.2 mg/dL   GFR calc non Af Amer >60 >60 mL/min   GFR calc Af Amer >60 >60 mL/min   Anion gap 11 5 - 15  Brain natriuretic peptide     Status: None   Collection Time: 09/18/17  8:06 PM  Result Value Ref Range   B Natriuretic Peptide 29.0 0.0 - 100.0 pg/mL   ____________________________________________ ____________________________________________  RADIOLOGY  I personally reviewed all radiographic images ordered to evaluate for the above acute complaints and reviewed radiology reports and findings.  These findings were personally discussed with the patient.  Please see medical record for radiology report.  ____________________________________________   PROCEDURES  Procedure(s) performed:  Procedures    Critical Care performed: no ____________________________________________   INITIAL IMPRESSION / ASSESSMENT AND PLAN / ED COURSE  Pertinent labs & imaging results that were available during my care of the patient were  reviewed by me and considered in my medical decision making (see chart for details).   DDX: dvt, fracture, disclocation, cellulitis  Nalina Yeatman is a 52 y.o. who presents to the ED with symptoms as described above.  Radiographs ordered to evaluate for fracture show no evidence of fracture.  Blood work is reassuring.  Ultrasound of lower extremity ordered to evaluate for DVT shows none.  Pain most likely secondary to contusion with associated peripheral edema.  Will give refill on her lisinopril as well as Lasix and short course of Ultram.  Have discussed with the patient and available family all diagnostics and treatments performed thus far and all questions were answered to the best of my ability. The patient demonstrates understanding and agreement with plan.  As part of my medical decision making, I reviewed the following data within the electronic MEDICAL RECORD NUMBER Nursing notes reviewed and incorporated, Labs reviewed, notes from prior ED visits and [place] Controlled Substance Database   ____________________________________________   FINAL CLINICAL IMPRESSION(S) / ED DIAGNOSES  Final diagnoses:  Leg swelling  Acute pain of left knee      NEW MEDICATIONS STARTED DURING THIS VISIT:  Discharge Medication List as of 09/18/2017 10:08 PM    START taking these medications   Details  furosemide (LASIX) 20 MG tablet Take 1 tablet (20 mg total) by mouth daily as needed for edema., Starting Thu 09/18/2017, Until Fri 09/18/2018, Print    !! traMADol (ULTRAM) 50 MG tablet Take 1 tablet (50 mg total) by mouth every 6 (six) hours as needed., Starting Thu 09/18/2017, Until Fri 09/18/2018, Print     !! - Potential duplicate medications found. Please discuss with provider.       Note:  This document was prepared using Dragon voice recognition software and may include unintentional dictation errors.    Willy Eddy, MD 09/18/17 2300

## 2018-04-22 ENCOUNTER — Other Ambulatory Visit: Payer: Self-pay

## 2018-04-22 ENCOUNTER — Inpatient Hospital Stay
Admission: EM | Admit: 2018-04-22 | Discharge: 2018-04-24 | DRG: 603 | Disposition: A | Discharge status: Home / Self Care | Payer: Self-pay | Attending: Specialist | Admitting: Specialist

## 2018-04-22 ENCOUNTER — Encounter: Payer: Self-pay | Admitting: Emergency Medicine

## 2018-04-22 ENCOUNTER — Emergency Department: Payer: Self-pay

## 2018-04-22 DIAGNOSIS — Z7984 Long term (current) use of oral hypoglycemic drugs: Secondary | ICD-10-CM

## 2018-04-22 DIAGNOSIS — I10 Essential (primary) hypertension: Secondary | ICD-10-CM | POA: Diagnosis present

## 2018-04-22 DIAGNOSIS — R011 Cardiac murmur, unspecified: Secondary | ICD-10-CM | POA: Diagnosis present

## 2018-04-22 DIAGNOSIS — F17210 Nicotine dependence, cigarettes, uncomplicated: Secondary | ICD-10-CM | POA: Diagnosis present

## 2018-04-22 DIAGNOSIS — L0390 Cellulitis, unspecified: Secondary | ICD-10-CM | POA: Diagnosis present

## 2018-04-22 DIAGNOSIS — L03116 Cellulitis of left lower limb: Principal | ICD-10-CM | POA: Diagnosis present

## 2018-04-22 DIAGNOSIS — K521 Toxic gastroenteritis and colitis: Secondary | ICD-10-CM | POA: Diagnosis present

## 2018-04-22 DIAGNOSIS — Z23 Encounter for immunization: Secondary | ICD-10-CM

## 2018-04-22 DIAGNOSIS — E119 Type 2 diabetes mellitus without complications: Secondary | ICD-10-CM

## 2018-04-22 DIAGNOSIS — E1165 Type 2 diabetes mellitus with hyperglycemia: Secondary | ICD-10-CM | POA: Diagnosis present

## 2018-04-22 DIAGNOSIS — Z886 Allergy status to analgesic agent status: Secondary | ICD-10-CM

## 2018-04-22 DIAGNOSIS — Z885 Allergy status to narcotic agent status: Secondary | ICD-10-CM

## 2018-04-22 DIAGNOSIS — T383X5A Adverse effect of insulin and oral hypoglycemic [antidiabetic] drugs, initial encounter: Secondary | ICD-10-CM | POA: Diagnosis present

## 2018-04-22 DIAGNOSIS — Z79899 Other long term (current) drug therapy: Secondary | ICD-10-CM

## 2018-04-22 DIAGNOSIS — H6690 Otitis media, unspecified, unspecified ear: Secondary | ICD-10-CM | POA: Diagnosis present

## 2018-04-22 DIAGNOSIS — R55 Syncope and collapse: Secondary | ICD-10-CM

## 2018-04-22 DIAGNOSIS — E1140 Type 2 diabetes mellitus with diabetic neuropathy, unspecified: Secondary | ICD-10-CM | POA: Diagnosis present

## 2018-04-22 DIAGNOSIS — N3000 Acute cystitis without hematuria: Secondary | ICD-10-CM | POA: Diagnosis present

## 2018-04-22 LAB — BASIC METABOLIC PANEL
Anion gap: 8 (ref 5–15)
BUN: 15 mg/dL (ref 6–20)
CO2: 28 mmol/L (ref 22–32)
Calcium: 9.2 mg/dL (ref 8.9–10.3)
Chloride: 99 mmol/L (ref 98–111)
Creatinine, Ser: 0.59 mg/dL (ref 0.44–1.00)
GFR calc Af Amer: >60 mL/min (ref >60)
GFR calc non Af Amer: >60 mL/min (ref >60)
Glucose, Bld: 411 mg/dL — ABNORMAL HIGH (ref 70–99)
Potassium: 4.4 mmol/L (ref 3.5–5.1)
SODIUM: 135 mmol/L (ref 135–145)

## 2018-04-22 LAB — CBC
HCT: 42.4 % (ref 36.0–46.0)
Hemoglobin: 13.6 g/dL (ref 12.0–15.0)
MCH: 28.5 pg (ref 26.0–34.0)
MCHC: 32.1 g/dL (ref 30.0–36.0)
MCV: 88.9 fL (ref 80.0–100.0)
Platelets: 296 10*3/uL (ref 150–400)
RBC: 4.77 MIL/uL (ref 3.87–5.11)
RDW: 13.2 % (ref 11.5–15.5)
WBC: 9.2 10*3/uL (ref 4.0–10.5)
nRBC: 0 % (ref 0.0–0.2)

## 2018-04-22 LAB — GLUCOSE, CAPILLARY
Glucose-Capillary: 368 mg/dL — ABNORMAL HIGH (ref 70–99)
Glucose-Capillary: 267 mg/dL — ABNORMAL HIGH (ref 70–99)

## 2018-04-22 LAB — URINALYSIS, COMPLETE (UACMP) WITH MICROSCOPIC
Bilirubin Urine: NEGATIVE
Glucose, UA: >=500 mg/dL — AB
Ketones, ur: NEGATIVE mg/dL
Nitrite: NEGATIVE
Protein, ur: 30 mg/dL — AB
Specific Gravity, Urine: 1.027 (ref 1.005–1.030)
WBC, UA: >50 WBC/hpf — ABNORMAL HIGH (ref 0–5)
pH: 6 (ref 5.0–8.0)

## 2018-04-22 LAB — TROPONIN I: Troponin I: <0.03 ng/mL (ref <0.03)

## 2018-04-22 LAB — BRAIN NATRIURETIC PEPTIDE: B Natriuretic Peptide: 53 pg/mL (ref 0.0–100.0)

## 2018-04-22 MED ORDER — ENOXAPARIN SODIUM 40 MG/0.4ML ~~LOC~~ SOLN
40.0000 mg | SUBCUTANEOUS | Status: DC
  Administered 2018-04-22 – 2018-04-23 (×2): 40 mg via SUBCUTANEOUS
  Filled 2018-04-22 (×3): qty 0.4

## 2018-04-22 MED ORDER — INSULIN ASPART 100 UNIT/ML ~~LOC~~ SOLN
0.0000 [IU] | Freq: Three times a day (TID) | SUBCUTANEOUS | Status: DC
  Administered 2018-04-23 (×2): 5 [IU] via SUBCUTANEOUS
  Filled 2018-04-22 (×2): qty 1

## 2018-04-22 MED ORDER — OXYCODONE HCL 5 MG PO TABS
5.0000 mg | ORAL_TABLET | ORAL | Status: DC | PRN
  Administered 2018-04-22 – 2018-04-24 (×9): 5 mg via ORAL
  Filled 2018-04-22 (×9): qty 1

## 2018-04-22 MED ORDER — FUROSEMIDE 20 MG PO TABS
20.0000 mg | ORAL_TABLET | Freq: Every day | ORAL | Status: DC
  Administered 2018-04-23 – 2018-04-24 (×2): 20 mg via ORAL
  Filled 2018-04-22 (×2): qty 1

## 2018-04-22 MED ORDER — ONDANSETRON HCL 4 MG/2ML IJ SOLN: 4.0000 mg | Freq: Four times a day (QID) | INTRAMUSCULAR | Status: DC | PRN

## 2018-04-22 MED ORDER — INSULIN GLARGINE 100 UNIT/ML ~~LOC~~ SOLN
10.0000 [IU] | Freq: Every day | SUBCUTANEOUS | Status: DC
  Administered 2018-04-22: 10 [IU] via SUBCUTANEOUS
  Filled 2018-04-22 (×3): qty 0.1

## 2018-04-22 MED ORDER — SODIUM CHLORIDE 0.9 % IV SOLN
1.0000 g | Freq: Once | INTRAVENOUS | Status: AC
  Administered 2018-04-22: 1 g via INTRAVENOUS
  Filled 2018-04-22: qty 10

## 2018-04-22 MED ORDER — ONDANSETRON HCL 4 MG PO TABS: 4.0000 mg | ORAL_TABLET | Freq: Four times a day (QID) | ORAL | Status: DC | PRN

## 2018-04-22 MED ORDER — NICOTINE 21 MG/24HR TD PT24
21.0000 mg | MEDICATED_PATCH | Freq: Every day | TRANSDERMAL | Status: DC | PRN
  Administered 2018-04-22 – 2018-04-23 (×2): 21 mg via TRANSDERMAL
  Filled 2018-04-22 (×2): qty 1

## 2018-04-22 MED ORDER — SODIUM CHLORIDE 0.9 % IV SOLN
3.0000 g | Freq: Three times a day (TID) | INTRAVENOUS | Status: DC
  Administered 2018-04-23 – 2018-04-24 (×4): 3 g via INTRAVENOUS
  Filled 2018-04-22 (×7): qty 3

## 2018-04-22 MED ORDER — IBUPROFEN 600 MG PO TABS
600.0000 mg | ORAL_TABLET | ORAL | Status: AC
  Administered 2018-04-22: 600 mg via ORAL
  Filled 2018-04-22: qty 1

## 2018-04-22 MED ORDER — PNEUMOCOCCAL VAC POLYVALENT 25 MCG/0.5ML IJ INJ
0.5000 mL | INJECTION | INTRAMUSCULAR | Status: AC
  Administered 2018-04-24: 09:00:00 0.5 mL via INTRAMUSCULAR
  Filled 2018-04-22: qty 0.5

## 2018-04-22 MED ORDER — INSULIN ASPART 100 UNIT/ML ~~LOC~~ SOLN
0.0000 [IU] | Freq: Every day | SUBCUTANEOUS | Status: DC
  Administered 2018-04-22: 3 [IU] via SUBCUTANEOUS
  Filled 2018-04-22: qty 1

## 2018-04-22 MED ORDER — LISINOPRIL 10 MG PO TABS
10.0000 mg | ORAL_TABLET | Freq: Every day | ORAL | Status: DC
  Administered 2018-04-23 – 2018-04-24 (×2): 10 mg via ORAL
  Filled 2018-04-22 (×2): qty 1

## 2018-04-22 MED ORDER — INSULIN ASPART 100 UNIT/ML ~~LOC~~ SOLN
4.0000 [IU] | Freq: Once | SUBCUTANEOUS | Status: AC
  Administered 2018-04-22: 4 [IU] via SUBCUTANEOUS
  Filled 2018-04-22: qty 1

## 2018-04-22 MED ORDER — INFLUENZA VAC SPLIT QUAD 0.5 ML IM SUSY
0.5000 mL | PREFILLED_SYRINGE | INTRAMUSCULAR | Status: AC
  Administered 2018-04-24: 0.5 mL via INTRAMUSCULAR
  Filled 2018-04-22: qty 0.5

## 2018-04-22 NOTE — H&P (Addendum)
[facility] [place] [facility]   PATIENT NAME: Valerie Cross    MR#:  [id number]  DATE OF BIRTH:  04/26/1965  DATE OF ADMISSION:  04/22/2018  PRIMARY CARE PHYSICIAN: Patient, No Pcp Per   REQUESTING/REFERRING PHYSICIAN: Dr Sharyn Creamer  CHIEF COMPLAINT:   Chief Complaint  Patient presents with  . Dizziness    HISTORY OF PRESENT ILLNESS:  Valerie Cross  is a 53 y.o. female with a known history of coming in with numerous complaints including left calf pain and swelling.  She states the pain is so bad that she can hardly walk 8 feet has like a numb throbbing feeling 6 out of 10 intensity pain worse when she walks.  She has been feeling dizzy and the room is been spinning.  She has been having bouts of diarrhea because of the Glucophage.  She has been having some leg swelling.  She is also having burning on urination hospitalist services were contacted for further evaluation  PAST MEDICAL HISTORY:   Past Medical History:  Diagnosis Date  . Diabetes mellitus without complication (HCC)   . Hypertension     PAST SURGICAL HISTORY:   Past Surgical History:  Procedure Laterality Date  . ABDOMINAL HYSTERECTOMY    . APPENDECTOMY    . CHOLECYSTECTOMY    . NASAL SEPTUM SURGERY    . PARTIAL HYSTERECTOMY    . TONSILLECTOMY      SOCIAL HISTORY:   Social History   Tobacco Use  . Smoking status: Current Every Day Smoker    Packs/day: 1.00    Types: Cigarettes  . Smokeless tobacco: Never Used  Substance Use Topics  . Alcohol use: No    FAMILY HISTORY:   Family History  Problem Relation Age of Onset  . Kidney disease Mother   . Hypertension Mother   . Transient ischemic attack Father   . CAD Father     DRUG ALLERGIES:   Allergies  Allergen Reactions  . Codeine Anaphylaxis  . Tylenol [Acetaminophen] Anaphylaxis    REVIEW OF SYSTEMS:  CONSTITUTIONAL: No fever, positive chills.  Positive for weight loss 46 pounds.  Some fatigue or weakness.   Positive for room spinning and dizziness. EYES: Positive for blurred vision EARS, NOSE, AND THROAT: No tinnitus or ear pain.  Occasional runny nose and sore throat.  Positive for hoarse voice.  Sometimes feels like things get stuck in her throat. RESPIRATORY: Positive for cough, no shortness of breath, wheezing or hemoptysis.  CARDIOVASCULAR: No chest pain, orthopnea, edema.  GASTROINTESTINAL: Positive for acid reflux.  No nausea, vomiting,  or abdominal pain. No blood in bowel movements.  Positive for diarrhea GENITOURINARY: Positive for dysuria, no hematuria.  ENDOCRINE: No polyuria, nocturia,  HEMATOLOGY: No anemia, easy bruising or bleeding SKIN: No rash or lesion. MUSCULOSKELETAL: Positive for left leg pain NEUROLOGIC: No tingling, numbness, weakness.  PSYCHIATRY: No anxiety or depression.   MEDICATIONS AT HOME:   Prior to Admission medications   Medication Sig Start Date End Date Taking? Authorizing Provider  cephALEXin (KEFLEX) 500 MG capsule Take 1 capsule (500 mg total) by mouth 3 (three) times daily. 08/19/16   Tommi Rumps, PA-C  furosemide (LASIX) 20 MG tablet Take 1 tablet (20 mg total) by mouth daily as needed for edema. 09/18/17 09/18/18  Willy Eddy, MD  lisinopril (PRINIVIL,ZESTRIL) 10 MG tablet Take 1 tablet (10 mg total) by mouth daily. 09/18/17 09/18/18  Willy Eddy, MD  metFORMIN (GLUCOPHAGE) 500 MG tablet Take 1 tablet (  500 mg total) by mouth 2 (two) times daily with a meal. 08/19/16   Tommi RumpsSummers, Rhonda L, PA-C  traMADol (ULTRAM) 50 MG tablet Take 1 tablet (50 mg total) by mouth every 6 (six) hours as needed. 08/19/16   Tommi RumpsSummers, Rhonda L, PA-C  traMADol (ULTRAM) 50 MG tablet Take 1 tablet (50 mg total) by mouth every 6 (six) hours as needed. 09/18/17 09/18/18  Willy Eddyobinson, Patrick, MD      VITAL SIGNS:  Blood pressure 115/64, pulse 84, temperature 98.1 F (36.7 C), temperature source Oral, resp. rate 18, height 5\' 7"  (1.702 m), weight 87.1 kg, SpO2 95  %.  PHYSICAL EXAMINATION:  GENERAL:  53 y.o.-year-old patient lying in the bed with no acute distress.  EYES: Pupils equal, round, reactive to light and accommodation. No scleral icterus. Extraocular muscles intact.  HEENT: Head atraumatic, normocephalic. Oropharynx and nasopharynx clear.  Tympanic membrane bilateral erythema worse on the left side NECK:  Supple, no jugular venous distention. No thyroid enlargement, no tenderness.  LUNGS: Normal breath sounds bilaterally, no wheezing, rales,rhonchi or crepitation. No use of accessory muscles of respiration.  CARDIOVASCULAR: S1, S2 normal.  3-6 systolic murmurs, no rubs, or gallops.  ABDOMEN: Soft, nontender, nondistended. Bowel sounds present. No organomegaly or mass.  EXTREMITIES: Left leg 3+ pedal edema, right leg 2+ edema.  No cyanosis, or clubbing.  NEUROLOGIC: Cranial nerves II through XII are intact. Muscle strength 5/5 in all extremities. Sensation intact. Gait not checked.  PSYCHIATRIC: The patient is alert and oriented x 3.  SKIN: No rash, lesion, or ulcer.  Chronic lower extremity brownish discoloration  LABORATORY PANEL:   CBC Recent Labs  Lab 04/22/18 1449  WBC 9.2  HGB 13.6  HCT 42.4  PLT 296   ------------------------------------------------------------------------------------------------------------------  Chemistries  Recent Labs  Lab 04/22/18 1449  NA 135  K 4.4  CL 99  CO2 28  GLUCOSE 411*  BUN 15  CREATININE 0.59  CALCIUM 9.2   ------------------------------------------------------------------------------------------------------------------  Cardiac Enzymes Recent Labs  Lab 04/22/18 1449  TROPONINI <0.03   ------------------------------------------------------------------------------------------------------------------  RADIOLOGY:  Dg Chest 2 View  Result Date: 04/22/2018 CLINICAL DATA:  Edema.  Syncope. EXAM: CHEST - 2 VIEW COMPARISON:  09/17/2015 FINDINGS: Normal heart size and mediastinal  contours. Mild generalized interstitial coarsening, also seen previously. No acute infiltrate or edema. No effusion or pneumothorax. No acute osseous findings. IMPRESSION: Mild interstitial coarsening, likely related to history of smoking. No focal finding. Electronically Signed   By: Marnee SpringJonathon  Watts M.D.   On: 04/22/2018 18:53    EKG:   Normal sinus rhythm 87 bpm Q waves anteriorly, incomplete left bundle branch block  IMPRESSION AND PLAN:   1.  Left leg cellulitis and swelling and pain.  Patient states that she can take oxycodone for pain.  Patient ordered a dose of Rocephin in the ED.  I will switch over to Unasyn to cover otitis media and acute cystitis also.  Sonogram negative for DVT 2.  Otitis media and dizziness and vertigo.  Unasyn should cover. 3.  Acute cystitis.  Send off urine culture.  Unasyn will cover. 4.  Essential hypertension.  Continue lisinopril 5.  Heart murmur and swelling of the lower extremities.  Check an echocardiogram 6.  Type 2 diabetes mellitus with hyperglycemia.  Add on a hemoglobin A1c.  Put on sliding scale.  Since the patient does have a lot of diarrhea I will hold off on Glucophage at this time.  Start low-dose Lantus tonight. 7.  Tobacco abuse.  Smoking cessation counseling done 4 minutes by me.  Nicotine patch as needed.  All the records are reviewed and case discussed with ED provider. Management plans discussed with the patient, family and they are in agreement.  CODE STATUS: full code  TOTAL TIME TAKING CARE OF THIS PATIENT: .    Alford Highland M.D on 04/22/2018 at 8:42 PM  Between 7am to 6pm - Pager - [phone]  After 6pm call admission pager [phone]  [facility]  [phone]  CC: Primary care physician; Patient, No Pcp Per

## 2018-04-22 NOTE — ED Triage Notes (Signed)
Patient presents to the ED with intermittent dizziness x 2 months.  Patient states yesterday she passed out.  Patient states often when she stands the room is spinning.  Patient states her legs are more swollen than normal as well.  Patient states, "I've been putting it off and putting it off."

## 2018-04-22 NOTE — ED Provider Notes (Signed)
[facility] Emergency Department Provider Note   ____________________________________________   First MD Initiated Contact with Patient 04/22/18 1758     (approximate)  I have reviewed the triage vital signs and the nursing notes.   HISTORY  Chief Complaint Dizziness    HPI Valerie Cross is a 53 y.o. female is a history of diabetes and neuropathy  Patient reports she has not been feeling well for few days time, but also reports that yesterday she was doing dishes when she suddenly became lightheaded and passed out.  She did not hit her head or become injured, but reports she is been having episodes of lightheadedness intermittently in the last that briefly passed out.  She got up this morning she noticed also that she is been having some swelling in both legs, little more in the left with some slight left calf pain. She is also noticed that the toes of her feet seem a little bit darker than usual on the left.  She has pain in both lower feet from "neuropathy".  She also reports 2 months ago she was in [place] and had pneumonia, they told her then that she had a heart murmur which was new.  She is never seen a heart doctor, does not see a primary care doctor.  No nausea vomiting.  No fevers or chills.  Feels just a little short of breath but no cough.   Past Medical History:  Diagnosis Date  . Diabetes mellitus without complication (HCC)   . Hypertension     Patient Active Problem List   Diagnosis Date Noted  . Cellulitis 04/22/2018    Past Surgical History:  Procedure Laterality Date  . ABDOMINAL HYSTERECTOMY    . APPENDECTOMY    . CHOLECYSTECTOMY    . NASAL SEPTUM SURGERY    . PARTIAL HYSTERECTOMY    . TONSILLECTOMY      Prior to Admission medications   Medication Sig Start Date End Date Taking? Authorizing Provider  cephALEXin (KEFLEX) 500 MG capsule Take 1 capsule (500 mg total) by mouth 3 (three) times daily. 08/19/16   Tommi RumpsSummers, Rhonda  L, PA-C  furosemide (LASIX) 20 MG tablet Take 1 tablet (20 mg total) by mouth daily as needed for edema. 09/18/17 09/18/18  Willy Eddyobinson, Patrick, MD  lisinopril (PRINIVIL,ZESTRIL) 10 MG tablet Take 1 tablet (10 mg total) by mouth daily. 09/18/17 09/18/18  Willy Eddyobinson, Patrick, MD  metFORMIN (GLUCOPHAGE) 500 MG tablet Take 1 tablet (500 mg total) by mouth 2 (two) times daily with a meal. 08/19/16   Tommi RumpsSummers, Rhonda L, PA-C  traMADol (ULTRAM) 50 MG tablet Take 1 tablet (50 mg total) by mouth every 6 (six) hours as needed. 08/19/16   Tommi RumpsSummers, Rhonda L, PA-C  traMADol (ULTRAM) 50 MG tablet Take 1 tablet (50 mg total) by mouth every 6 (six) hours as needed. 09/18/17 09/18/18  Willy Eddyobinson, Patrick, MD    Allergies Codeine and Tylenol [acetaminophen]  No family history on file.  Social History Social History   Tobacco Use  . Smoking status: Current Every Day Smoker    Packs/day: 1.00    Types: Cigarettes  . Smokeless tobacco: Never Used  Substance Use Topics  . Alcohol use: No  . Drug use: No    Review of Systems Constitutional: No fever/chills Eyes: No visual changes. ENT: No sore throat. Cardiovascular: Denies chest pain. Respiratory: See HPI Gastrointestinal: No abdominal pain.   Genitourinary: Negative for dysuria. Musculoskeletal: Negative for back pain. Skin: Negative for rash. Neurological: Negative  for headaches, areas of focal weakness or numbness.  See HPI regarding syncope.    ____________________________________________   PHYSICAL EXAM:  VITAL SIGNS: ED Triage Vitals  Enc Vitals Group     BP 04/22/18 1437 132/80     Pulse Rate 04/22/18 1437 91     Resp 04/22/18 1437 18     Temp 04/22/18 1437 98.1 F (36.7 C)     Temp Source 04/22/18 1437 Oral     SpO2 04/22/18 1437 94 %     Weight 04/22/18 1437 192 lb (87.1 kg)     Height 04/22/18 1437 5\' 7"  (1.702 m)     Head Circumference --      Peak Flow --      Pain Score 04/22/18 1446 6     Pain Loc --      Pain Edu? --       Excl. in GC? --     Constitutional: Alert and oriented. Well appearing and in no acute distress.  She is very pleasant. Eyes: Conjunctivae are normal. Head: Atraumatic. Nose: No congestion/rhinnorhea. Mouth/Throat: Mucous membranes are moist. Neck: No stridor.  Cardiovascular: Normal rate, regular rhythm.   There is a moderate systolic ejection murmur good peripheral circulation. Respiratory: Normal respiratory effort.  No retractions. Lungs CTAB. Gastrointestinal: Soft and nontender. No distention. Musculoskeletal: No lower extremity tenderness except for discomfort over both ankles and feet bilaterally which she reports is chronic due to "neuropathy".  She has a strong palpable dorsalis pedis pulse in the right lower extremity, seems diminished in the left lower extremity but strongly dopplerable posterior tibial and dorsalis pedis pulses witjh no obvious cyanosis of the nailbeds, capillary refill about 1 seconds all digits left lower toes there is some erythema overlying the left anterior shin as well Neurologic:  Normal speech and language. No gross focal neurologic deficits are appreciated.  Skin:  Skin is warm, dry and intact. No rash noted. Psychiatric: Mood and affect are normal. Speech and behavior are normal.  ____________________________________________   LABS (all labs ordered are listed, but only abnormal results are displayed)  Labs Reviewed  BASIC METABOLIC PANEL - Abnormal; Notable for the following components:      Result Value   Glucose, Bld 411 (*)    All other components within normal limits  URINALYSIS, COMPLETE (UACMP) WITH MICROSCOPIC - Abnormal; Notable for the following components:   Color, Urine YELLOW (*)    APPearance CLOUDY (*)    Glucose, UA >=500 (*)    Hgb urine dipstick SMALL (*)    Protein, ur 30 (*)    Leukocytes, UA MODERATE (*)    WBC, UA >50 (*)    Bacteria, UA MANY (*)    All other components within normal limits  GLUCOSE, CAPILLARY -  Abnormal; Notable for the following components:   Glucose-Capillary 368 (*)    All other components within normal limits  URINE CULTURE  CBC  TROPONIN I  BRAIN NATRIURETIC PEPTIDE  HIV ANTIBODY (ROUTINE TESTING W REFLEX)  BASIC METABOLIC PANEL  CBC  HEMOGLOBIN A1C  CBG MONITORING, ED   ____________________________________________  EKG  Reviewed enterotomy at 1445 Heart rate 99 QRS 120 QTc 470 Normal sinus rhythm, abnormal EKG, appearance of a probable left bundle branch block ____________________________________________  RADIOLOGY  Dg Chest 2 View  Result Date: 04/22/2018 CLINICAL DATA:  Edema.  Syncope. EXAM: CHEST - 2 VIEW COMPARISON:  09/17/2015 FINDINGS: Normal heart size and mediastinal contours. Mild generalized interstitial coarsening, also seen previously. No  acute infiltrate or edema. No effusion or pneumothorax. No acute osseous findings. IMPRESSION: Mild interstitial coarsening, likely related to history of smoking. No focal finding. Electronically Signed   By: Marnee Spring M.D.   On: 04/22/2018 18:53    ____________________________________________   PROCEDURES  Procedure(s) performed: None  Procedures  Critical Care performed:   ____________________________________________   INITIAL IMPRESSION / ASSESSMENT AND PLAN / ED COURSE  Pertinent labs & imaging results that were available during my care of the patient were reviewed by me and considered in my medical decision making (see chart for details).   Patient presents for evaluation after syncopal episode.  Also associated with some left lower extremity edema, slight discomfort in the left lower leg.  Slightly diminished pulses to palpation, but also some slight associated edema.  This is likely some cellulitis.  Of note also she reports syncope and reports that she was told recently about 2 months ago about a new heart murmur, this is never been fully evaluated.  In the setting of her symptoms, I suspect  she likely needs observation, treatment for urinary tract infection and cellulitis as well as likely echocardiogram.  Discussed with the hospitalist service, they will admit for further care and management.  Patient in agreement.      ____________________________________________   FINAL CLINICAL IMPRESSION(S) / ED DIAGNOSES  Final diagnoses:  Cellulitis of left lower leg  Syncope and collapse  Heart murmur        Note:  This document was prepared using Dragon voice recognition software and may include unintentional dictation errors       Sharyn Creamer, MD 04/22/18 2041

## 2018-04-22 NOTE — Progress Notes (Signed)
Pharmacy Antibiotic Note  Valerie Cross is a 53 y.o. female admitted on 04/22/2018 with cellulitis and UTI and ottitis media.  Pharmacy has been consulted for Unasyn dosing.  Plan: Will order Unasyn 3 g q8H.  Height: 5\' 7"  (170.2 cm) Weight: 192 lb (87.1 kg) IBW/kg (Calculated) : 61.6  Temp (24hrs), Avg:98.1 F (36.7 C), Min:98.1 F (36.7 C), Max:98.1 F (36.7 C)  Recent Labs  Lab 04/22/18 1449  WBC 9.2  CREATININE 0.59    Estimated Creatinine Clearance: 93.2 mL/min (by C-G formula based on SCr of 0.59 mg/dL).    Allergies  Allergen Reactions  . Codeine Anaphylaxis  . Tylenol [Acetaminophen] Anaphylaxis    Antimicrobials this admission: 1/1 ceftriaxone >> 1/1 1/1 Unasyn >>   Dose adjustments this admission: none  Microbiology results: 1/1 UCx: pending    Thank you for allowing pharmacy to be a part of this patient's care.  Ronnald Ramp, PharmD, BCPS Clinical Pharmacist 04/22/2018 8:51 PM

## 2018-04-23 ENCOUNTER — Inpatient Hospital Stay
Admit: 2018-04-23 | Discharge: 2018-04-23 | Disposition: A | Discharge status: Home / Self Care | Payer: Self-pay | Attending: Internal Medicine | Admitting: Internal Medicine

## 2018-04-23 LAB — ECHOCARDIOGRAM COMPLETE
Height: 67 in
Weight: 3072 oz

## 2018-04-23 LAB — BASIC METABOLIC PANEL
Anion gap: 5 (ref 5–15)
BUN: 16 mg/dL (ref 6–20)
CO2: 30 mmol/L (ref 22–32)
Calcium: 8.6 mg/dL — ABNORMAL LOW (ref 8.9–10.3)
Chloride: 100 mmol/L (ref 98–111)
Creatinine, Ser: 0.6 mg/dL (ref 0.44–1.00)
GFR calc Af Amer: >60 mL/min (ref >60)
GFR calc non Af Amer: >60 mL/min (ref >60)
Glucose, Bld: 288 mg/dL — ABNORMAL HIGH (ref 70–99)
Potassium: 3.9 mmol/L (ref 3.5–5.1)
Sodium: 135 mmol/L (ref 135–145)

## 2018-04-23 LAB — GLUCOSE, CAPILLARY
Glucose-Capillary: 297 mg/dL — ABNORMAL HIGH (ref 70–99)
Glucose-Capillary: 251 mg/dL — ABNORMAL HIGH (ref 70–99)
Glucose-Capillary: 238 mg/dL — ABNORMAL HIGH (ref 70–99)
Glucose-Capillary: 225 mg/dL — ABNORMAL HIGH (ref 70–99)

## 2018-04-23 LAB — CBC
HCT: 37.9 % (ref 36.0–46.0)
Hemoglobin: 12.1 g/dL (ref 12.0–15.0)
MCH: 28.5 pg (ref 26.0–34.0)
MCHC: 31.9 g/dL (ref 30.0–36.0)
MCV: 89.4 fL (ref 80.0–100.0)
Platelets: 255 10*3/uL (ref 150–400)
RBC: 4.24 MIL/uL (ref 3.87–5.11)
RDW: 13.3 % (ref 11.5–15.5)
WBC: 8.8 10*3/uL (ref 4.0–10.5)
nRBC: 0 % (ref 0.0–0.2)

## 2018-04-23 LAB — HEMOGLOBIN A1C
Hgb A1c MFr Bld: 13.1 % — ABNORMAL HIGH (ref 4.8–5.6)
Mean Plasma Glucose: 329.27 mg/dL

## 2018-04-23 MED ORDER — INSULIN STARTER KIT- SYRINGES (ENGLISH)
1.0000 | Freq: Once | Status: DC
  Filled 2018-04-23: qty 1

## 2018-04-23 MED ORDER — INSULIN ASPART 100 UNIT/ML ~~LOC~~ SOLN
0.0000 [IU] | Freq: Three times a day (TID) | SUBCUTANEOUS | Status: DC
  Administered 2018-04-23: 18:00:00 3 [IU] via SUBCUTANEOUS
  Administered 2018-04-24 (×2): 5 [IU] via SUBCUTANEOUS
  Filled 2018-04-23 (×3): qty 1

## 2018-04-23 MED ORDER — INSULIN ASPART 100 UNIT/ML ~~LOC~~ SOLN
0.0000 [IU] | Freq: Every day | SUBCUTANEOUS | Status: DC
  Administered 2018-04-23: 22:00:00 2 [IU] via SUBCUTANEOUS
  Filled 2018-04-23: qty 1

## 2018-04-23 MED ORDER — SODIUM CHLORIDE 0.9 % IV SOLN
INTRAVENOUS | Status: DC | PRN
  Administered 2018-04-23 (×2): 250 mL via INTRAVENOUS

## 2018-04-23 MED ORDER — INSULIN GLARGINE 100 UNIT/ML ~~LOC~~ SOLN
15.0000 [IU] | Freq: Every day | SUBCUTANEOUS | Status: DC
  Administered 2018-04-23: 15 [IU] via SUBCUTANEOUS
  Filled 2018-04-23 (×2): qty 0.15

## 2018-04-23 MED ORDER — INSULIN ASPART 100 UNIT/ML ~~LOC~~ SOLN
4.0000 [IU] | Freq: Three times a day (TID) | SUBCUTANEOUS | Status: DC
  Administered 2018-04-23 – 2018-04-24 (×3): 4 [IU] via SUBCUTANEOUS
  Filled 2018-04-23 (×3): qty 1

## 2018-04-23 MED ORDER — LIVING WELL WITH DIABETES BOOK
Freq: Once | Status: AC
  Administered 2018-04-23: 12:00:00
  Filled 2018-04-23: qty 1

## 2018-04-23 NOTE — Progress Notes (Signed)
*  PRELIMINARY RESULTS* Echocardiogram 2D Echocardiogram has been performed.  Cristela Blue 04/23/2018, 10:06 AM

## 2018-04-23 NOTE — Progress Notes (Addendum)
Inpatient Diabetes Program Recommendations  AACE/ADA: New Consensus Statement on Inpatient Glycemic Control (2019)  Target Ranges:  Prepandial:   less than 140 mg/dL      Peak postprandial:   less than 180 mg/dL (1-2 hours)      Critically ill patients:  140 - 180 mg/dL  Results for DASHONDA, BONNEAU (MRN [id number]) as of 04/23/2018 09:32  Ref. Range 08/19/2016 13:06 04/22/2018 18:02 04/22/2018 22:20 04/23/2018 07:42  Glucose-Capillary Latest Ref Range: 70 - 99 mg/dL 414 (H) 368 (H) 267 (H) 297 (H)   Results for LADESHA, PACINI (MRN [id number]) as of 04/23/2018 09:32  Ref. Range 09/18/2017 20:06 04/22/2018 14:49  Glucose Latest Ref Range: 70 - 99 mg/dL 387 (H) 411 (H)   Results for PARVEEN, FREEHLING (MRN [id number]) as of 04/23/2018 09:32  Ref. Range 04/22/2018 22:14  Hemoglobin A1C Latest Ref Range: 4.8 - 5.6 % 13.1 (H)   Review of Glycemic Control  Diabetes history: DM2 Outpatient Diabetes medications: Metformin 500 mg BID Current orders for Inpatient glycemic control: Lantus 10 units QHS, Novolog 0-9 units TID with meals, Novolog 0-5 units QHS  Inpatient Diabetes Program Recommendations:  Insulin - Basal: Please consider increasing Lantus to 15 units QHS. Insulin - Meal Coverage: Please consider ordering Novolog 4 units TID with meals for meal coverage if patient eats at least 50% of meals. HgbA1C: A1C 13.1% on 04/22/18 indicating an average glucose of 329 mg/dl over the past 2-3 months. Recommend patient be discharged on insulin. If MD agreeable, recommend discharging on 70/30 12 units BID (will provide 16.8 units for basal and 7.2 units for meal coverage per day).  NOTE: Initial glucose 411 mg/dl on 04/22/18 at 14:49 (also noted lab glucose of 387 mg/dl on 09/18/17 which was obtain during a Emergency Room visit). A1C 13.1% on 04/22/18 indicating an average glucose of 329 mg/dl. Noted patient does NOT have any insurance and No PCP. Ordered consult for CM for medication needs and follow up. Ordered Living Well with DM  book, insulin starter kit in case patient is discharged on insulin, and patient education on DM and insulin by bedside RNs. Will plan to talk with patient today.  Addendum 04/23/18'@11' :30-Spoke with patient about diabetes and home regimen for diabetes control. Patient reports that she currently does not have insurance or a PCP. Patient notes that [place]CP but since moving to [place] she does not have a PCP to follow up with. Patient states that she is taking Metformin 500 mg BID but she notes excessive diarrhea with Metformin. Inquired about past DM medications and she states that she has taken Januvia, Janument, and Novolin 70/30 in the past for DM control. Patient states she was purchasing 70/30 at [facility] for $25 per vial but it has been over 1 year since she last took insulin. Patient notes that she does not check her glucose at home and she has a very old glucometer and will need a new glucometer.  Discussed A1C results (13.1% on 04/22/18) and explained that her current A1C indicates an average glucose of 329 mg/dl over the past 2-3 months. Discussed glucose and A1C goals. Discussed importance of checking CBGs and maintaining good CBG control to prevent long-term and short-term complications. Explained how hyperglycemia leads to damage within blood vessels which lead to the common complications seen with uncontrolled diabetes. Stressed to the patient the importance of improving glycemic control to prevent further complications from uncontrolled diabetes. Discussed impact of nutrition, exercise, stress,  sickness, and medications on diabetes control.  Discussed carbohydrates, carbohydrate goals per day and meal, along with portion sizes. Patient requested further education on diet by RD; will order RD consult. Explained to patient that it would be recommended for her to be discharged on insulin (will ask for affordable 70/30 insulin).  Patient is agreeable to taking insulin as an  outpatient. Reviewed 70/30 insulin and had patient demonstrate how to draw up and practice injecting insulin with vial/syringe. Patient was able to successfully demonstrate how to draw up and administer insulin with vial/syringe. Informed patient it will be requested that CM provide a glucometer if they have one. If not, encouraged patient to go to [facility] and get Reli-On Prime glucometer ($9) and a box of 50 test strips for $9. Provided handout on Reli-On products at [facility].  Encouraged patient to check her glucose at least 2 times per day,to keep a log book of glucose readings and insulin taken, and be sure to establish care with a provider locally and follow up routinely.   Patient verbalized understanding of information discussed and she states that she has no further questions at this time related to diabetes.  Thanks, Barnie Alderman, RN, MSN, CDE Diabetes Coordinator Inpatient Diabetes Program [phone] (Team Pager from 8am to 5pm)

## 2018-04-23 NOTE — Progress Notes (Signed)
[facility] [place] [facility]      PATIENT NAME: Valerie Cross    MR#:  [id number]  DATE OF BIRTH:  1965-12-31  SUBJECTIVE:   Patient presented to the hospital due to left leg swelling pain and suspected to have cellulitis.  Also noted to have urinalysis positive for UTI.  Still complaining of significant pain in that left leg.  Afebrile, normal white cell count.  REVIEW OF SYSTEMS:    Review of Systems  Constitutional: Negative for chills and fever.  HENT: Negative for congestion and tinnitus.   Eyes: Negative for blurred vision and double vision.  Respiratory: Negative for cough, shortness of breath and wheezing.   Cardiovascular: Positive for leg swelling (left leg with some pain). Negative for chest pain, orthopnea and PND.  Gastrointestinal: Negative for abdominal pain, diarrhea, nausea and vomiting.  Genitourinary: Negative for dysuria and hematuria.  Neurological: Negative for dizziness, sensory change and focal weakness.  All other systems reviewed and are negative.   Nutrition: Carb control/Heart healthy Tolerating Diet: Yes Tolerating PT: Await Eval.   DRUG ALLERGIES:   Allergies  Allergen Reactions  . Codeine Anaphylaxis  . Tylenol [Acetaminophen] Anaphylaxis    VITALS:  Blood pressure 134/64, pulse 79, temperature 98 F (36.7 C), temperature source Oral, resp. rate 18, height 5\' 7"  (1.702 m), weight 87.1 kg, SpO2 95 %.  PHYSICAL EXAMINATION:   Physical Exam  GENERAL:  53 y.o.-year-old patient lying in bed in no acute distress.  EYES: Pupils equal, round, reactive to light and accommodation. No scleral icterus. Extraocular muscles intact.  HEENT: Head atraumatic, normocephalic. Oropharynx and nasopharynx clear.  NECK:  Supple, no jugular venous distention. No thyroid enlargement, no tenderness.  LUNGS: Normal breath sounds bilaterally, no wheezing, rales, rhonchi. No use of accessory muscles of respiration.  CARDIOVASCULAR: S1, S2  normal. No murmurs, rubs, or gallops.  ABDOMEN: Soft, nontender, nondistended. Bowel sounds present. No organomegaly or mass.  EXTREMITIES: No cyanosis, clubbing, +1 edema LLE compared to right. Slightly warm to touch LLE with mild redness.  NEUROLOGIC: Cranial nerves II through XII are intact. No focal Motor or sensory deficits b/l.   PSYCHIATRIC: The patient is alert and oriented x 3.  SKIN: No obvious rash, lesion, or ulcer.    LABORATORY PANEL:   CBC Recent Labs  Lab 04/23/18 0325  WBC 8.8  HGB 12.1  HCT 37.9  PLT 255   ------------------------------------------------------------------------------------------------------------------  Chemistries  Recent Labs  Lab 04/23/18 0325  NA 135  K 3.9  CL 100  CO2 30  GLUCOSE 288*  BUN 16  CREATININE 0.60  CALCIUM 8.6*   ------------------------------------------------------------------------------------------------------------------  Cardiac Enzymes Recent Labs  Lab 04/22/18 1449  TROPONINI <0.03   ------------------------------------------------------------------------------------------------------------------  RADIOLOGY:  Dg Chest 2 View  Result Date: 04/22/2018 CLINICAL DATA:  Edema.  Syncope. EXAM: CHEST - 2 VIEW COMPARISON:  09/17/2015 FINDINGS: Normal heart size and mediastinal contours. Mild generalized interstitial coarsening, also seen previously. No acute infiltrate or edema. No effusion or pneumothorax. No acute osseous findings. IMPRESSION: Mild interstitial coarsening, likely related to history of smoking. No focal finding. Electronically Signed   By: Marnee Spring M.D.   On: 04/22/2018 18:53   US Venous Img Lower Unilateral Left  Result Date: 04/22/2018 CLINICAL DATA:  53 year old female with a history of cramping EXAM: LEFT LOWER EXTREMITY VENOUS DOPPLER ULTRASOUND TECHNIQUE: Gray-scale sonography with graded compression, as well as color Doppler and duplex ultrasound were performed to evaluate the lower  extremity deep venous  systems from the level of the common femoral vein and including the common femoral, femoral, profunda femoral, popliteal and calf veins including the posterior tibial, peroneal and gastrocnemius veins when visible. The superficial great saphenous vein was also interrogated. Spectral Doppler was utilized to evaluate flow at rest and with distal augmentation maneuvers in the common femoral, femoral and popliteal veins. COMPARISON:  None. FINDINGS: Contralateral Common Femoral Vein: Respiratory phasicity is normal and symmetric with the symptomatic side. No evidence of thrombus. Normal compressibility. Common Femoral Vein: No evidence of thrombus. Normal compressibility, respiratory phasicity and response to augmentation. Saphenofemoral Junction: No evidence of thrombus. Normal compressibility and flow on color Doppler imaging. Profunda Femoral Vein: No evidence of thrombus. Normal compressibility and flow on color Doppler imaging. Femoral Vein: No evidence of thrombus. Normal compressibility, respiratory phasicity and response to augmentation. Popliteal Vein: No evidence of thrombus. Normal compressibility, respiratory phasicity and response to augmentation. Calf Veins: No evidence of thrombus. Normal compressibility and flow on color Doppler imaging. Superficial Great Saphenous Vein: No evidence of thrombus. Normal compressibility and flow on color Doppler imaging. Other Findings:  None. IMPRESSION: Sonographic survey of the left lower extremity negative for DVT Electronically Signed   By: Gilmer Mor D.O.   On: 04/22/2018 20:40     ASSESSMENT AND PLAN:   53 year old female with past medical history of diabetes, hypertension, ongoing tobacco abuse who presented to the hospital due to left lower extremity pain and swelling.  1.  Left lower extremity cellulitis- patient has a very mild cellulitis as do not appreciate much redness or swelling.  Patient is afebrile with a normal white cell  count.  We will continue IV Unasyn for now. -Dopplers were negative for DVT.  2.  Diabetes type 2 without complication-seen by diabetes coordinator.  Will advance Lantus dose, add NovoLog with meals, continue sliding scale insulin.  3.  Essential hypertension-continue lisinopril.    4.  Urinary tract infection based off a urinalysis-patient is clinically asymptomatic.  Continue Unasyn.   5.  Tobacco abuse-continue nicotine patch.  Likely discharge home tomorrow on oral Abx.   All the records are reviewed and case discussed with Care Management/Social Worker. Management plans discussed with the patient, family and they are in agreement.  CODE STATUS: Full code  DVT Prophylaxis: Lovenox  TOTAL TIME TAKING CARE OF THIS PATIENT: 30 minutes.   POSSIBLE D/C IN 1-2 DAYS, DEPENDING ON CLINICAL CONDITION.   Houston Siren M.D on 04/23/2018 at 1:35 PM  Between 7am to 6pm - Pager - [phone]  After 6pm go to [email or web address] - Social research officer, government  [facility] [place] [facility]  Office  [phone]  CC: Primary care physician; Patient, No Pcp Per

## 2018-04-24 ENCOUNTER — Other Ambulatory Visit: Payer: Self-pay

## 2018-04-24 DIAGNOSIS — Z79899 Other long term (current) drug therapy: Secondary | ICD-10-CM | POA: Insufficient documentation

## 2018-04-24 DIAGNOSIS — I10 Essential (primary) hypertension: Secondary | ICD-10-CM | POA: Insufficient documentation

## 2018-04-24 DIAGNOSIS — F17210 Nicotine dependence, cigarettes, uncomplicated: Secondary | ICD-10-CM | POA: Insufficient documentation

## 2018-04-24 DIAGNOSIS — L03116 Cellulitis of left lower limb: Secondary | ICD-10-CM | POA: Insufficient documentation

## 2018-04-24 LAB — CBC
HCT: 38.6 % (ref 36.0–46.0)
Hemoglobin: 12.5 g/dL (ref 12.0–15.0)
MCH: 29.2 pg (ref 26.0–34.0)
MCHC: 32.4 g/dL (ref 30.0–36.0)
MCV: 90.2 fL (ref 80.0–100.0)
Platelets: 257 10*3/uL (ref 150–400)
RBC: 4.28 MIL/uL (ref 3.87–5.11)
RDW: 13.3 % (ref 11.5–15.5)
WBC: 9.3 10*3/uL (ref 4.0–10.5)
nRBC: 0 % (ref 0.0–0.2)

## 2018-04-24 LAB — GLUCOSE, CAPILLARY
Glucose-Capillary: 259 mg/dL — ABNORMAL HIGH (ref 70–99)
Glucose-Capillary: 276 mg/dL — ABNORMAL HIGH (ref 70–99)
Glucose-Capillary: 354 mg/dL — ABNORMAL HIGH (ref 70–99)

## 2018-04-24 LAB — HIV ANTIBODY (ROUTINE TESTING W REFLEX): HIV SCREEN 4TH GENERATION: NONREACTIVE

## 2018-04-24 MED ORDER — FLUCONAZOLE 50 MG PO TABS
150.0000 mg | ORAL_TABLET | Freq: Once | ORAL | Status: AC
  Administered 2018-04-24: 150 mg via ORAL
  Filled 2018-04-24: qty 3

## 2018-04-24 MED ORDER — AMOXICILLIN-POT CLAVULANATE 875-125 MG PO TABS: 1.0000 | ORAL_TABLET | Freq: Two times a day (BID) | ORAL | 0 refills | Status: AC

## 2018-04-24 MED ORDER — INSULIN NPH ISOPHANE & REGULAR (70-30) 100 UNIT/ML ~~LOC~~ SUSP: 15.0000 [IU] | Freq: Two times a day (BID) | SUBCUTANEOUS | 1 refills | Status: AC

## 2018-04-24 MED ORDER — FUROSEMIDE 20 MG PO TABS: 20.0000 mg | ORAL_TABLET | Freq: Every day | ORAL | 0 refills | Status: AC | PRN

## 2018-04-24 NOTE — Progress Notes (Signed)
Patient did not wait to hear back regarding status of metformin. The decision was made to have her discontinue it. Called patient on cell to make aware that she was to discontinue use of her metformin per Dr. Cherlynn Kaiser and diabetes coordinator. Patient verbalized understanding.

## 2018-04-24 NOTE — Progress Notes (Signed)
Inpatient Diabetes Program Recommendations  AACE/ADA: New Consensus Statement on Inpatient Glycemic Control   Target Ranges:  Prepandial:   less than 140 mg/dL      Peak postprandial:   less than 180 mg/dL (1-2 hours)      Critically ill patients:  140 - 180 mg/dL   Results for Valerie Cross, Valerie Cross (MRN [id number]) as of 04/24/2018 11:05  Ref. Range 04/23/2018 07:42 04/23/2018 11:55 04/23/2018 17:21 04/23/2018 21:23 04/24/2018 07:42  Glucose-Capillary Latest Ref Range: 70 - 99 mg/dL 409 (H) 811 (H) 914 (H) 225 (H) 259 (H)   Review of Glycemic Control Diabetes history: DM2 Outpatient Diabetes medications: Metformin 500 mg BID Current orders for Inpatient glycemic control: Lantus 15 units QHS, Novolog 0-9 units TID with meals, Novolog 0-5 units QHS, Novolog 4 units TID with meals  Inpatient Diabetes Program Recommendations:   Insulin - Basal: Please consider increasing Lantus to 20 units QHS.  Insulin - Meal Coverage: Please consider increasing meal coverage to Novolog 6 units TID if patient eats at least 50% of meals.  HgbA1C: A1C 13.1% on 04/22/18 indicating an average glucose of 329 mg/dl over the past 2-3 months. Recommend patient be discharged on affordable insulin. If MD agreeable, recommend discharging on Novolin 70/30 15 units BID (will provide 21 units for basal and 9 units for meal coverage per day).  Thanks, Orlando Penner, RN, MSN, CDE Diabetes Coordinator Inpatient Diabetes Program [phone] (Team Pager from 8am to 5pm)

## 2018-04-24 NOTE — Progress Notes (Signed)
Pharmacy Antibiotic Note  Valerie Cross is a 53 y.o. female admitted on 04/22/2018 with cellulitis and UTI and ottitis media.  Pharmacy has been consulted for Unasyn dosing.  Plan: Will continue Unasyn 3 g q8H. Recommend a total of 5 days of therapy.   Height: 5\' 7"  (170.2 cm) Weight: 192 lb (87.1 kg) IBW/kg (Calculated) : 61.6  Temp (24hrs), Avg:98 F (36.7 C), Min:98 F (36.7 C), Max:98.1 F (36.7 C)  Recent Labs  Lab 04/22/18 1449 04/23/18 0325  WBC 9.2 8.8  CREATININE 0.59 0.60    Estimated Creatinine Clearance: 93.2 mL/min (by C-G formula based on SCr of 0.6 mg/dL).    Allergies  Allergen Reactions  . Codeine Anaphylaxis  . Tylenol [Acetaminophen] Anaphylaxis    Antimicrobials this admission: 1/1 ceftriaxone >> 1/1 1/1 Unasyn >>   Dose adjustments this admission: none  Microbiology results: 1/1 UCx: pending    Thank you for allowing pharmacy to be a part of this patient's care.  Ronnald Ramp, PharmD, BCPS Clinical Pharmacist 04/24/2018 8:17 AM

## 2018-04-24 NOTE — Discharge Summary (Signed)
[facility] [place] [facility]   PATIENT NAME: Valerie SchaumannMary Cross    MR#:  [id number]  DATE OF BIRTH:  [birth year]  DATE OF ADMISSION:  04/22/2018 ADMITTING PHYSICIAN: Alford Highlandichard Wieting, MD  DATE OF DISCHARGE: 04/24/2018  2:50 PM  PRIMARY CARE PHYSICIAN: Patient, No Pcp Per    ADMISSION DIAGNOSIS:  Syncope and collapse [R55] Heart murmur [R01.1] Cellulitis of left lower leg [L03.116]  DISCHARGE DIAGNOSIS:  Active Problems:   Cellulitis   SECONDARY DIAGNOSIS:   Past Medical History:  Diagnosis Date  . Diabetes mellitus without complication (HCC)   . Hypertension     HOSPITAL COURSE:   53 year old female with past medical history of diabetes, hypertension, ongoing tobacco abuse who presented to the hospital due to left lower extremity pain and swelling.  1.  Left lower extremity cellulitis- was admitted with left lower extremity pain redness and swelling and suspected to have cellulitis.  She was afebrile and hemodynamic stable with a normal white cell count. -Empirically patient was treated with IV Unasyn.  She has improved.  She is being discharged on oral Augmentin.    2.  Diabetes type 2 without complication-seen by diabetes coordinator.    She was on metformin prior to coming to the hospital.  She was having diarrhea secondary to it.  Diabetes coordinator recommended starting her insulin as her hemoglobin A1c was as high as 13. -Patient was given a glucometer, test strips, lancets and discharged on Novolin 70/30 15 units twice daily.  Further changes to her diabetic regimen can be done as an outpatient.    3.  Essential hypertension- she will continue lisinopril.    4.  Urinary tract infection based off a urinalysis-patient was clinically asymptomatic. -Urine cultures are positive for 100,000 colonies of E. coli.  Sensitivities are pending.  She was discharged on oral Augmentin which should treat this adequately.  5.  Tobacco abuse- while in the hospital  pt. Was on nicotine patch.  - pt. Was strongly advised to quit smoking.    DISCHARGE CONDITIONS:   Stable.   CONSULTS OBTAINED:    DRUG ALLERGIES:   Allergies  Allergen Reactions  . Codeine Anaphylaxis  . Tylenol [Acetaminophen] Anaphylaxis    DISCHARGE MEDICATIONS:   Allergies as of 04/24/2018      Reactions   Codeine Anaphylaxis   Tylenol [acetaminophen] Anaphylaxis      Medication List    STOP taking these medications   metFORMIN 500 MG tablet Commonly known as:  GLUCOPHAGE     TAKE these medications   amoxicillin-clavulanate 875-125 MG tablet Commonly known as:  AUGMENTIN Take 1 tablet by mouth 2 (two) times daily for 5 days.   furosemide 20 MG tablet Commonly known as:  LASIX Take 1 tablet (20 mg total) by mouth daily as needed for edema.   insulin NPH-regular Human (70-30) 100 UNIT/ML injection Commonly known as:  NOVOLIN 70/30 Inject 15 Units into the skin 2 (two) times daily with a meal.   lisinopril 10 MG tablet Commonly known as:  PRINIVIL,ZESTRIL Take 1 tablet (10 mg total) by mouth daily.   traMADol 50 MG tablet Commonly known as:  ULTRAM Take 1 tablet (50 mg total) by mouth every 6 (six) hours as needed.         DISCHARGE INSTRUCTIONS:   DIET:  Cardiac diet and Diabetic diet  DISCHARGE CONDITION:  Stable  ACTIVITY:  Activity as tolerated  OXYGEN:  Home Oxygen: No.   Oxygen Delivery: room air  DISCHARGE LOCATION:  home   If you experience worsening of your admission symptoms, develop shortness of breath, life threatening emergency, suicidal or homicidal thoughts you must seek medical attention immediately by calling 911 or calling your MD immediately  if symptoms less severe.  You Must read complete instructions/literature along with all the possible adverse reactions/side effects for all the Medicines you take and that have been prescribed to you. Take any new Medicines after you have completely understood and accpet all the  possible adverse reactions/side effects.   Please note  You were cared for by a hospitalist during your hospital stay. If you have any questions about your discharge medications or the care you received while you were in the hospital after you are discharged, you can call the unit and asked to speak with the hospitalist on call if the hospitalist that took care of you is not available. Once you are discharged, your primary care physician will handle any further medical issues. Please note that NO REFILLS for any discharge medications will be authorized once you are discharged, as it is imperative that you return to your primary care physician (or establish a relationship with a primary care physician if you do not have one) for your aftercare needs so that they can reassess your need for medications and monitor your lab values.     Today   Left lower extremity redness slightly improved.  Afebrile, hemodynamically stable.  White cell count is normal.  Will discharge home today on oral antibiotics.  VITAL SIGNS:  Blood pressure 124/66, pulse 89, temperature 98 F (36.7 C), temperature source Oral, resp. rate 20, height 5\' 7"  (1.702 m), weight 87.1 kg, SpO2 92 %.  I/O:    Intake/Output Summary (Last 24 hours) at 04/24/2018 1639 Last data filed at 04/23/2018 2100 Gross per 24 hour  Intake -  Output 1 ml  Net -1 ml    PHYSICAL EXAMINATION:   GENERAL:  53 y.o.-year-old patient lying in bed in no acute distress.  EYES: Pupils equal, round, reactive to light and accommodation. No scleral icterus. Extraocular muscles intact.  HEENT: Head atraumatic, normocephalic. Oropharynx and nasopharynx clear.  NECK:  Supple, no jugular venous distention. No thyroid enlargement, no tenderness.  LUNGS: Normal breath sounds bilaterally, no wheezing, rales, rhonchi. No use of accessory muscles of respiration.  CARDIOVASCULAR: S1, S2 normal. No murmurs, rubs, or gallops.  ABDOMEN: Soft, nontender,  nondistended. Bowel sounds present. No organomegaly or mass.  EXTREMITIES: No cyanosis, clubbing, slight LLE edema compared to right. No redneess or warmth.  NEUROLOGIC: Cranial nerves II through XII are intact. No focal Motor or sensory deficits b/l.   PSYCHIATRIC: The patient is alert and oriented x 3.  SKIN: No obvious rash, lesion, or ulcer.    DATA REVIEW:   CBC Recent Labs  Lab 04/24/18 0741  WBC 9.3  HGB 12.5  HCT 38.6  PLT 257    Chemistries  Recent Labs  Lab 04/23/18 0325  NA 135  K 3.9  CL 100  CO2 30  GLUCOSE 288*  BUN 16  CREATININE 0.60  CALCIUM 8.6*    Cardiac Enzymes Recent Labs  Lab 04/22/18 1449  TROPONINI <0.03    Microbiology Results  Results for orders placed or performed during the hospital encounter of 04/22/18  Urine Culture     Status: Abnormal (Preliminary result)   Collection Time: 04/22/18  6:36 PM  Result Value Ref Range Status   Specimen Description   Final  URINE, RANDOM Performed at [facility], [address], [place], [place] [address]    Special Requests   Final    Normal Performed at [facility], [address] [place], [place] [address]    Culture (A)  Final    >=100,000 COLONIES/mL ESCHERICHIA COLI IDENTIFICATION AND SUSCEPTIBILITIES TO FOLLOW Performed at [facility], [address] [place], [place] [address]    Report Status PENDING  Incomplete    RADIOLOGY:  Dg Chest 2 View  Result Date: 04/22/2018 CLINICAL DATA:  Edema.  Syncope. EXAM: CHEST - 2 VIEW COMPARISON:  09/17/2015 FINDINGS: Normal heart size and mediastinal contours. Mild generalized interstitial coarsening, also seen previously. No acute infiltrate or edema. No effusion or pneumothorax. No acute osseous findings. IMPRESSION: Mild interstitial coarsening, likely related to history of smoking. No focal finding. Electronically Signed   By: Marnee Spring M.D.   On: 04/22/2018 18:53   US Venous Img Lower Unilateral  Left  Result Date: 04/22/2018 CLINICAL DATA:  53 year old female with a history of cramping EXAM: LEFT LOWER EXTREMITY VENOUS DOPPLER ULTRASOUND TECHNIQUE: Gray-scale sonography with graded compression, as well as color Doppler and duplex ultrasound were performed to evaluate the lower extremity deep venous systems from the level of the common femoral vein and including the common femoral, femoral, profunda femoral, popliteal and calf veins including the posterior tibial, peroneal and gastrocnemius veins when visible. The superficial great saphenous vein was also interrogated. Spectral Doppler was utilized to evaluate flow at rest and with distal augmentation maneuvers in the common femoral, femoral and popliteal veins. COMPARISON:  None. FINDINGS: Contralateral Common Femoral Vein: Respiratory phasicity is normal and symmetric with the symptomatic side. No evidence of thrombus. Normal compressibility. Common Femoral Vein: No evidence of thrombus. Normal compressibility, respiratory phasicity and response to augmentation. Saphenofemoral Junction: No evidence of thrombus. Normal compressibility and flow on color Doppler imaging. Profunda Femoral Vein: No evidence of thrombus. Normal compressibility and flow on color Doppler imaging. Femoral Vein: No evidence of thrombus. Normal compressibility, respiratory phasicity and response to augmentation. Popliteal Vein: No evidence of thrombus. Normal compressibility, respiratory phasicity and response to augmentation. Calf Veins: No evidence of thrombus. Normal compressibility and flow on color Doppler imaging. Superficial Great Saphenous Vein: No evidence of thrombus. Normal compressibility and flow on color Doppler imaging. Other Findings:  None. IMPRESSION: Sonographic survey of the left lower extremity negative for DVT Electronically Signed   By: Gilmer Mor D.O.   On: 04/22/2018 20:40      Management plans discussed with the patient, family and they are in  agreement.  CODE STATUS:     Code Status Orders  (From admission, onward)         Start     Ordered   04/22/18 2037  Full code  Continuous     04/22/18 2037        Code Status History    This patient has a current code status but no historical code status.      TOTAL TIME TAKING CARE OF THIS PATIENT: 40 minutes.    Houston Siren M.D on 04/24/2018 at 4:39 PM  Between 7am to 6pm - Pager - [phone]  After 6pm go to [email or web address] - Social research officer, government  [facility]  Office  [phone]  CC: Primary care physician; Patient, No Pcp Per

## 2018-04-24 NOTE — Care Management Note (Signed)
Case Management Note  Patient Details  Name: Valerie Cross MRN: [id number] Date of Birth: [birth year]  Subjective/Objective:   Admitted to [facility] with the diagnosis of cellulitis. Lives with boyfriend, Baxrer "Chip". Moved from [place] to [place] 6 months ago. Mother is Santiago Glad [phone]). Patient's telephone # [phone]. Usually goes to [facility] for prescriptions. States she gets her insulin at [facility] for $25.00. No insurance. No income. Lives in [place].                  Action/Plan: Glucometer kit given, Application to [facility] and Medication Management given. Referral to [employer].    Expected Discharge Date:                  Expected Discharge Plan:     In-House Referral:    yes Discharge planning Services   yes  Post Acute Care Choice:    Choice offered to:     DME Arranged:    DME Agency:     HH Arranged:    HH Agency:     Status of Service:     If discussed at [employer] of Stay Meetings, dates discussed:    Additional Comments:  Shelbie Ammons, RN MSN CCM Care Management  [phone] 04/24/2018, 10:53 AM

## 2018-04-24 NOTE — ED Triage Notes (Signed)
Pt here for fsbs of 439 at home, states was admitted for the same 2 days ago and discharged today. States also feels clammy and co headache.

## 2018-04-24 NOTE — Plan of Care (Signed)
  RD consulted for nutrition education regarding diabetes.   Lab Results  Component Value Date   HGBA1C 13.1 (H) 04/22/2018    RD provided "Carbohydrate Counting for People with Diabetes" handout from the Academy of Nutrition and Dietetics. Discussed different food groups and their effects on blood sugar, emphasizing carbohydrate-containing foods. Provided list of carbohydrates and recommended serving sizes of common foods. Provided nutrition label handout.  Discussed importance of controlled and consistent carbohydrate intake throughout the day. Provided examples of ways to balance meals/snacks and encouraged intake of high-fiber, whole grain complex carbohydrates. Teach back method used.  Expect fair compliance.  Body mass index is 30.07 kg/m. Pt meets criteria for based on current BMI.  Current diet order is HH/CHO, patient is consuming approximately 85% of meals at this time. Labs and medications reviewed. No further nutrition interventions warranted at this time. RD contact information provided. If additional nutrition issues arise, please re-consult RD.  Lars Masson, RD, LDN  After Hours/Weekend Pager: [phone]

## 2018-04-25 ENCOUNTER — Emergency Department
Admission: EM | Admit: 2018-04-25 | Discharge: 2018-04-25 | Disposition: A | Discharge status: Home / Self Care | Payer: Self-pay | Attending: Emergency Medicine | Admitting: Emergency Medicine

## 2018-04-25 DIAGNOSIS — L03116 Cellulitis of left lower limb: Secondary | ICD-10-CM

## 2018-04-25 DIAGNOSIS — R739 Hyperglycemia, unspecified: Secondary | ICD-10-CM

## 2018-04-25 LAB — CBC
HCT: 43.8 % (ref 36.0–46.0)
Hemoglobin: 14.4 g/dL (ref 12.0–15.0)
MCH: 29.1 pg (ref 26.0–34.0)
MCHC: 32.9 g/dL (ref 30.0–36.0)
MCV: 88.5 fL (ref 80.0–100.0)
PLATELETS: 316 10*3/uL (ref 150–400)
RBC: 4.95 MIL/uL (ref 3.87–5.11)
RDW: 13.3 % (ref 11.5–15.5)
WBC: 11.9 10*3/uL — ABNORMAL HIGH (ref 4.0–10.5)
nRBC: 0 % (ref 0.0–0.2)

## 2018-04-25 LAB — COMPREHENSIVE METABOLIC PANEL
ALK PHOS: 274 U/L — AB (ref 38–126)
ALT: 64 U/L — AB (ref 0–44)
AST: 38 U/L (ref 15–41)
Albumin: 3.9 g/dL (ref 3.5–5.0)
Anion gap: 9 (ref 5–15)
BUN: 20 mg/dL (ref 6–20)
CO2: 29 mmol/L (ref 22–32)
Calcium: 8.9 mg/dL (ref 8.9–10.3)
Chloride: 94 mmol/L — ABNORMAL LOW (ref 98–111)
Creatinine, Ser: 0.62 mg/dL (ref 0.44–1.00)
GFR calc Af Amer: >60 mL/min (ref >60)
GFR calc non Af Amer: >60 mL/min (ref >60)
Glucose, Bld: 378 mg/dL — ABNORMAL HIGH (ref 70–99)
Potassium: 4 mmol/L (ref 3.5–5.1)
Sodium: 132 mmol/L — ABNORMAL LOW (ref 135–145)
Total Bilirubin: 0.9 mg/dL (ref 0.3–1.2)
Total Protein: 7.5 g/dL (ref 6.5–8.1)

## 2018-04-25 LAB — URINE CULTURE
Culture: >=100000 — AB
Special Requests: NORMAL

## 2018-04-25 LAB — TROPONIN I: Troponin I: <0.03 ng/mL (ref <0.03)

## 2018-04-25 MED ORDER — OXYCODONE HCL 5 MG PO TABS: 5.0000 mg | ORAL_TABLET | Freq: Three times a day (TID) | ORAL | 0 refills | Status: AC | PRN

## 2018-04-25 MED ORDER — OXYCODONE HCL 5 MG PO TABS
10.0000 mg | ORAL_TABLET | Freq: Once | ORAL | Status: AC
  Administered 2018-04-25: 10 mg via ORAL
  Filled 2018-04-25: qty 2

## 2018-04-25 MED ORDER — IBUPROFEN 600 MG PO TABS
600.0000 mg | ORAL_TABLET | Freq: Once | ORAL | Status: AC
  Administered 2018-04-25: 600 mg via ORAL
  Filled 2018-04-25: qty 1

## 2018-04-25 MED ORDER — INSULIN GLARGINE 100 UNIT/ML ~~LOC~~ SOLN
20.0000 [IU] | Freq: Once | SUBCUTANEOUS | Status: AC
  Administered 2018-04-25: 20 [IU] via SUBCUTANEOUS
  Filled 2018-04-25: qty 0.2

## 2018-04-25 MED ORDER — INSULIN GLARGINE 100 UNIT/ML ~~LOC~~ SOLN: SUBCUTANEOUS | 3 refills | Status: AC

## 2018-04-25 NOTE — Discharge Instructions (Addendum)
Please continue all of your antibiotics as prescribed and begin taking your long acting insulin every night to help with your long term sugar control.  Please make sure you get a [place] driver's license as soon as possible so you can follow up in clinic.  Return to the ED sooner for any concerns.  It was a pleasure to take care of you today, and thank you for coming to our emergency department.  If you have any questions or concerns before leaving please ask the nurse to grab me and I'm more than happy to go through your aftercare instructions again.  If you were prescribed any opioid pain medication today such as Norco, Vicodin, Percocet, morphine, hydrocodone, or oxycodone please make sure you do not drive when you are taking this medication as it can alter your ability to drive safely.  If you have any concerns once you are home that you are not improving or are in fact getting worse before you can make it to your follow-up appointment, please do not hesitate to call 911 and come back for further evaluation.  Merrily Brittle, MD  Results for orders placed or performed during the hospital encounter of 04/25/18  CBC  Result Value Ref Range   WBC 11.9 (H) 4.0 - 10.5 K/uL   RBC 4.95 3.87 - 5.11 MIL/uL   Hemoglobin 14.4 12.0 - 15.0 g/dL   HCT 16.1 09.6 - 04.5 %   MCV 88.5 80.0 - 100.0 fL   MCH 29.1 26.0 - 34.0 pg   MCHC 32.9 30.0 - 36.0 g/dL   RDW 40.9 81.1 - 91.4 %   Platelets 316 150 - 400 K/uL   nRBC 0.0 0.0 - 0.2 %  Comprehensive metabolic panel  Result Value Ref Range   Sodium 132 (L) 135 - 145 mmol/L   Potassium 4.0 3.5 - 5.1 mmol/L   Chloride 94 (L) 98 - 111 mmol/L   CO2 29 22 - 32 mmol/L   Glucose, Bld 378 (H) 70 - 99 mg/dL   BUN 20 6 - 20 mg/dL   Creatinine, Ser 7.82 0.44 - 1.00 mg/dL   Calcium 8.9 8.9 - 95.6 mg/dL   Total Protein 7.5 6.5 - 8.1 g/dL   Albumin 3.9 3.5 - 5.0 g/dL   AST 38 15 - 41 U/L   ALT 64 (H) 0 - 44 U/L   Alkaline Phosphatase 274 (H) 38 - 126  U/L   Total Bilirubin 0.9 0.3 - 1.2 mg/dL   GFR calc non Af Amer >60 >60 mL/min   GFR calc Af Amer >60 >60 mL/min   Anion gap 9 5 - 15  Troponin I - ONCE - STAT  Result Value Ref Range   Troponin I <0.03 <0.03 ng/mL  Glucose, capillary  Result Value Ref Range   Glucose-Capillary 354 (H) 70 - 99 mg/dL   Dg Chest 2 View  Result Date: 04/22/2018 CLINICAL DATA:  Edema.  Syncope. EXAM: CHEST - 2 VIEW COMPARISON:  09/17/2015 FINDINGS: Normal heart size and mediastinal contours. Mild generalized interstitial coarsening, also seen previously. No acute infiltrate or edema. No effusion or pneumothorax. No acute osseous findings. IMPRESSION: Mild interstitial coarsening, likely related to history of smoking. No focal finding. Electronically Signed   By: Marnee Spring M.D.   On: 04/22/2018 18:53   US Venous Img Lower Unilateral Left  Result Date: 04/22/2018 CLINICAL DATA:  53 year old female with a history of cramping EXAM: LEFT LOWER EXTREMITY VENOUS DOPPLER ULTRASOUND TECHNIQUE: Gray-scale sonography with  graded compression, as well as color Doppler and duplex ultrasound were performed to evaluate the lower extremity deep venous systems from the level of the common femoral vein and including the common femoral, femoral, profunda femoral, popliteal and calf veins including the posterior tibial, peroneal and gastrocnemius veins when visible. The superficial great saphenous vein was also interrogated. Spectral Doppler was utilized to evaluate flow at rest and with distal augmentation maneuvers in the common femoral, femoral and popliteal veins. COMPARISON:  None. FINDINGS: Contralateral Common Femoral Vein: Respiratory phasicity is normal and symmetric with the symptomatic side. No evidence of thrombus. Normal compressibility. Common Femoral Vein: No evidence of thrombus. Normal compressibility, respiratory phasicity and response to augmentation. Saphenofemoral Junction: No evidence of thrombus. Normal  compressibility and flow on color Doppler imaging. Profunda Femoral Vein: No evidence of thrombus. Normal compressibility and flow on color Doppler imaging. Femoral Vein: No evidence of thrombus. Normal compressibility, respiratory phasicity and response to augmentation. Popliteal Vein: No evidence of thrombus. Normal compressibility, respiratory phasicity and response to augmentation. Calf Veins: No evidence of thrombus. Normal compressibility and flow on color Doppler imaging. Superficial Great Saphenous Vein: No evidence of thrombus. Normal compressibility and flow on color Doppler imaging. Other Findings:  None. IMPRESSION: Sonographic survey of the left lower extremity negative for DVT Electronically Signed   By: Gilmer MorJaime  Wagner D.O.   On: 04/22/2018 20:40    It was a pleasure to take care of you today, and thank you for coming to our emergency department.  If you have any questions or concerns before leaving please ask the nurse to grab me and I'm more than happy to go through your aftercare instructions again.  If you were prescribed any opioid pain medication today such as Norco, Vicodin, Percocet, morphine, hydrocodone, or oxycodone please make sure you do not drive when you are taking this medication as it can alter your ability to drive safely.  If you have any concerns once you are home that you are not improving or are in fact getting worse before you can make it to your follow-up appointment, please do not hesitate to call 911 and come back for further evaluation.  Merrily BrittleNeil Kermitt Harjo, MD  Results for orders placed or performed during the hospital encounter of 04/25/18  CBC  Result Value Ref Range   WBC 11.9 (H) 4.0 - 10.5 K/uL   RBC 4.95 3.87 - 5.11 MIL/uL   Hemoglobin 14.4 12.0 - 15.0 g/dL   HCT 16.143.8 09.636.0 - 04.546.0 %   MCV 88.5 80.0 - 100.0 fL   MCH 29.1 26.0 - 34.0 pg   MCHC 32.9 30.0 - 36.0 g/dL   RDW 40.913.3 81.111.5 - 91.415.5 %   Platelets 316 150 - 400 K/uL   nRBC 0.0 0.0 - 0.2 %    Comprehensive metabolic panel  Result Value Ref Range   Sodium 132 (L) 135 - 145 mmol/L   Potassium 4.0 3.5 - 5.1 mmol/L   Chloride 94 (L) 98 - 111 mmol/L   CO2 29 22 - 32 mmol/L   Glucose, Bld 378 (H) 70 - 99 mg/dL   BUN 20 6 - 20 mg/dL   Creatinine, Ser 7.820.62 0.44 - 1.00 mg/dL   Calcium 8.9 8.9 - 95.610.3 mg/dL   Total Protein 7.5 6.5 - 8.1 g/dL   Albumin 3.9 3.5 - 5.0 g/dL   AST 38 15 - 41 U/L   ALT 64 (H) 0 - 44 U/L   Alkaline Phosphatase 274 (H) 38 - 126 U/L  Total Bilirubin 0.9 0.3 - 1.2 mg/dL   GFR calc non Af Amer >60 >60 mL/min   GFR calc Af Amer >60 >60 mL/min   Anion gap 9 5 - 15  Troponin I - ONCE - STAT  Result Value Ref Range   Troponin I <0.03 <0.03 ng/mL  Glucose, capillary  Result Value Ref Range   Glucose-Capillary 354 (H) 70 - 99 mg/dL   Dg Chest 2 View  Result Date: 04/22/2018 CLINICAL DATA:  Edema.  Syncope. EXAM: CHEST - 2 VIEW COMPARISON:  09/17/2015 FINDINGS: Normal heart size and mediastinal contours. Mild generalized interstitial coarsening, also seen previously. No acute infiltrate or edema. No effusion or pneumothorax. No acute osseous findings. IMPRESSION: Mild interstitial coarsening, likely related to history of smoking. No focal finding. Electronically Signed   By: Marnee SpringJonathon  Watts M.D.   On: 04/22/2018 18:53   [place] Venous Img Lower Unilateral Left  Result Date: 04/22/2018 CLINICAL DATA:  53 year old female with a history of cramping EXAM: LEFT LOWER EXTREMITY VENOUS DOPPLER ULTRASOUND TECHNIQUE: Gray-scale sonography with graded compression, as well as color Doppler and duplex ultrasound were performed to evaluate the lower extremity deep venous systems from the level of the common femoral vein and including the common femoral, femoral, profunda femoral, popliteal and calf veins including the posterior tibial, peroneal and gastrocnemius veins when visible. The superficial great saphenous vein was also interrogated. Spectral Doppler was utilized to evaluate  flow at rest and with distal augmentation maneuvers in the common femoral, femoral and popliteal veins. COMPARISON:  None. FINDINGS: Contralateral Common Femoral Vein: Respiratory phasicity is normal and symmetric with the symptomatic side. No evidence of thrombus. Normal compressibility. Common Femoral Vein: No evidence of thrombus. Normal compressibility, respiratory phasicity and response to augmentation. Saphenofemoral Junction: No evidence of thrombus. Normal compressibility and flow on color Doppler imaging. Profunda Femoral Vein: No evidence of thrombus. Normal compressibility and flow on color Doppler imaging. Femoral Vein: No evidence of thrombus. Normal compressibility, respiratory phasicity and response to augmentation. Popliteal Vein: No evidence of thrombus. Normal compressibility, respiratory phasicity and response to augmentation. Calf Veins: No evidence of thrombus. Normal compressibility and flow on color Doppler imaging. Superficial Great Saphenous Vein: No evidence of thrombus. Normal compressibility and flow on color Doppler imaging. Other Findings:  None. IMPRESSION: Sonographic survey of the left lower extremity negative for DVT Electronically Signed   By: Gilmer MorJaime  Wagner D.O.   On: 04/22/2018 20:40

## 2018-04-25 NOTE — ED Notes (Signed)
Pt informed she will not be able to drive for 4 hours post oxy ir administration. Pt verbalizes understanding and states she has a ride home.

## 2018-04-25 NOTE — ED Notes (Signed)
Patient ambulatory to stat desk in no acute distress asking about wait time. Patient given update on wait time. Patient verbalizes understanding.  

## 2018-04-25 NOTE — ED Provider Notes (Signed)
[facility] Emergency Department Provider Note  ____________________________________________   First MD Initiated Contact with Patient 04/25/18 [phone]     (approximate)  I have reviewed the triage vital signs and the nursing notes.   HISTORY  Chief Complaint Hyperglycemia   HPI Valerie Cross is a 53 y.o. female who comes to the emergency department with hyperglycemia.  She has a past medical history of diabetes mellitus as well as hypertension.  She was admitted to our hospital for several days and discharged yesterday with hyperglycemia and cellulitis to her left lower extremity.  She reports the discomfort in her left leg is improving but at home she noted her blood sugar was over 400 and she became concerned so she came to the hospital.  Her leg pain does persist and is currently moderate to severe.  She reports some difficulty getting up and down stairs.  Prior to her hospital stay she took metformin twice a day for many years which she said gave her "terrible diarrhea".  She was switched off of metformin to insulin 70/30 15 units twice a day.  As this was her first encounter with insulin she was not given any short acting insulin and she was instructed to find primary care to help her titrate her regimen in the future.  The patient reports compliance with her insulin but is concerned that her sugar remains high.  Her symptoms are insidious onset are now constant and nothing seems to make them better or worse.    Past Medical History:  Diagnosis Date  . Diabetes mellitus without complication (HCC)   . Hypertension     Patient Active Problem List   Diagnosis Date Noted  . Cellulitis 04/22/2018    Past Surgical History:  Procedure Laterality Date  . ABDOMINAL HYSTERECTOMY    . APPENDECTOMY    . CHOLECYSTECTOMY    . NASAL SEPTUM SURGERY    . PARTIAL HYSTERECTOMY    . TONSILLECTOMY      Prior to Admission medications   Medication Sig Start Date End Date  Taking? Authorizing Provider  amoxicillin-clavulanate (AUGMENTIN) 875-125 MG tablet Take 1 tablet by mouth 2 (two) times daily for 5 days. 04/24/18 04/29/18  Houston Siren, MD  furosemide (LASIX) 20 MG tablet Take 1 tablet (20 mg total) by mouth daily as needed for edema. 04/24/18 04/24/19  Houston Siren, MD  insulin glargine (LANTUS) 100 UNIT/ML injection Please take 20 units SQ every night. 04/25/18 04/25/19  Merrily Brittle, MD  insulin NPH-regular Human (NOVOLIN 70/30) (70-30) 100 UNIT/ML injection Inject 15 Units into the skin 2 (two) times daily with a meal. 04/24/18 06/23/18  Sainani, Rolly Pancake, MD  lisinopril (PRINIVIL,ZESTRIL) 10 MG tablet Take 1 tablet (10 mg total) by mouth daily. 09/18/17 09/18/18  Willy Eddy, MD  oxyCODONE (ROXICODONE) 5 MG immediate release tablet Take 1 tablet (5 mg total) by mouth every 8 (eight) hours as needed. 04/25/18 04/25/19  Merrily Brittle, MD  traMADol (ULTRAM) 50 MG tablet Take 1 tablet (50 mg total) by mouth every 6 (six) hours as needed. 08/19/16   Tommi Rumps, PA-C    Allergies Codeine and Tylenol [acetaminophen]  Family History  Problem Relation Age of Onset  . Kidney disease Mother   . Hypertension Mother   . Transient ischemic attack Father   . CAD Father     Social History Social History   Tobacco Use  . Smoking status: Current Every Day Smoker    Packs/day: 1.00  Types: Cigarettes  . Smokeless tobacco: Never Used  Substance Use Topics  . Alcohol use: No  . Drug use: No    Review of Systems Constitutional: No fever/chills Eyes: No visual changes. ENT: No sore throat. Cardiovascular: Denies chest pain. Respiratory: Denies shortness of breath. Gastrointestinal: No abdominal pain.  No nausea, no vomiting.  No diarrhea.  No constipation. Genitourinary: Negative for dysuria. Musculoskeletal: Positive for leg pain Skin: Negative for rash. Neurological: Negative for headaches, focal weakness or  numbness.   ____________________________________________   PHYSICAL EXAM:  VITAL SIGNS: ED Triage Vitals  Enc Vitals Group     BP 04/24/18 2322 (!) 165/74     Pulse Rate 04/24/18 2322 97     Resp 04/24/18 2322 20     Temp 04/24/18 2322 98.1 F (36.7 C)     Temp Source 04/24/18 2322 Oral     SpO2 04/24/18 2322 97 %     Weight 04/24/18 2323 192 lb (87.1 kg)     Height 04/24/18 2323 5\' 6"  (1.676 m)     Head Circumference --      Peak Flow --      Pain Score 04/24/18 2322 7     Pain Loc --      Pain Edu? --      Excl. in GC? --     Constitutional: Alert and oriented x4 appears somewhat uncomfortable although nontoxic in no diaphoresis Eyes: PERRL EOMI. Head: Atraumatic. Nose: No congestion/rhinnorhea. Mouth/Throat: No trismus Neck: No stridor.   Cardiovascular: Normal rate, regular rhythm. Grossly normal heart sounds.  Good peripheral circulation. Respiratory: Normal respiratory effort.  No retractions. Lungs CTAB and moving good air Gastrointestinal: Soft nontender Musculoskeletal: Left lower extremity slightly swollen with 1+ pitting edema Neurologic:  Normal speech and language. No gross focal neurologic deficits are appreciated. Skin: Left lower extremity with faint pink discoloration although no warmth bulla blisters sloughing or other signs of necrotizing infection Psychiatric: Mood and affect are normal. Speech and behavior are normal.    ____________________________________________   DIFFERENTIAL includes but not limited to  Cellulitis, hyperglycemia, DKA, HHS, dehydration ____________________________________________   LABS (all labs ordered are listed, but only abnormal results are displayed)  Labs Reviewed  CBC - Abnormal; Notable for the following components:      Result Value   WBC 11.9 (*)    All other components within normal limits  COMPREHENSIVE METABOLIC PANEL - Abnormal; Notable for the following components:   Sodium 132 (*)    Chloride 94  (*)    Glucose, Bld 378 (*)    ALT 64 (*)    Alkaline Phosphatase 274 (*)    All other components within normal limits  GLUCOSE, CAPILLARY - Abnormal; Notable for the following components:   Glucose-Capillary 354 (*)    All other components within normal limits  TROPONIN I    Lab work reviewed by me shows hyperglycemia but no signs of DKA __________________________________________  EKG  ED ECG REPORT I, Merrily BrittleNeil Evann Erazo, the attending physician, personally viewed and interpreted this ECG.  Date: 04/28/2018 EKG Time:  Rate: 92 Rhythm: normal sinus rhythm QRS Axis: Leftward axis Intervals: normal ST/T Wave abnormalities: normal Narrative Interpretation: no evidence of acute ischemia  ____________________________________________  RADIOLOGY  Left lower extremity DVT ultrasound from January 1 reviewed by me with no evidence of DVT ____________________________________________   PROCEDURES  Procedure(s) performed: no  Procedures  Critical Care performed: no  ____________________________________________   INITIAL IMPRESSION / ASSESSMENT AND PLAN / ED  COURSE  Pertinent labs & imaging results that were available during my care of the patient were reviewed by me and considered in my medical decision making (see chart for details).   As part of my medical decision making, I reviewed the following data within the electronic MEDICAL RECORD NUMBER History obtained from family if available, nursing notes, old chart and ekg, as well as notes from prior ED visits.  The patient comes to the emergency department with persistent left leg pain in the setting of cellulitis and hyperglycemia.  Her leg actually appears quite significantly improved and following ibuprofen and oral oxycodone her symptoms are significantly improved.  I will discharge her home with a short course of oxycodone to help with the pain as her cellulitis resolves.  Regarding her hyperglycemia I explained to the patient  that 15 units of 70/30 insulin twice a day will not be her final dose of insulin and she will require additional titration to help titrate her sugar.  Her issue is [place] of [place] about 2 weeks ago and has no health insurance and cannot follow-up at any of the free clinics that she does not have a [employer] [place]'s license.  I have given her a first dose of 20 units of Lantus here today and I will prescribe her a one-month supply and encouraged the patient to get her driver's license so she can get adequate follow-up.  She has no evidence of DKA she is discharged home in improved condition.      ____________________________________________   FINAL CLINICAL IMPRESSION(S) / ED DIAGNOSES  Final diagnoses:  Left leg cellulitis  Hyperglycemia      NEW MEDICATIONS STARTED DURING THIS VISIT:  Discharge Medication List as of 04/25/2018  5:25 AM    START taking these medications   Details  insulin glargine (LANTUS) 100 UNIT/ML injection Please take 20 units SQ every night., Print    oxyCODONE (ROXICODONE) 5 MG immediate release tablet Take 1 tablet (5 mg total) by mouth every 8 (eight) hours as needed., Starting Sat 04/25/2018, Until Sun 04/25/2019, Print         Note:  This document was prepared using Dragon voice recognition software and may include unintentional dictation errors.    Merrily Brittleifenbark, Archibald Marchetta, MD 04/28/18 [phone]

## 2018-04-25 NOTE — ED Notes (Signed)
Pt states she was discharged at 1430 yesterday after inpatient stay for cellulitis. Pt states she had had hyperglycemia since last pm. Pt denies polyuria, or increased thirst. Pt appears in no acute distress. Pt has not gotten her antibiotic prescription she was discharged with filled yet.

## 2018-09-27 ENCOUNTER — Other Ambulatory Visit: Payer: Self-pay

## 2018-09-27 ENCOUNTER — Emergency Department: Payer: Self-pay

## 2018-09-27 ENCOUNTER — Encounter: Payer: Self-pay | Admitting: Intensive Care

## 2018-09-27 ENCOUNTER — Emergency Department
Admission: EM | Admit: 2018-09-27 | Discharge: 2018-09-27 | Disposition: A | Discharge status: Home / Self Care | Payer: Self-pay | Attending: Emergency Medicine | Admitting: Emergency Medicine

## 2018-09-27 DIAGNOSIS — E119 Type 2 diabetes mellitus without complications: Secondary | ICD-10-CM | POA: Insufficient documentation

## 2018-09-27 DIAGNOSIS — F17210 Nicotine dependence, cigarettes, uncomplicated: Secondary | ICD-10-CM | POA: Insufficient documentation

## 2018-09-27 DIAGNOSIS — Z794 Long term (current) use of insulin: Secondary | ICD-10-CM | POA: Insufficient documentation

## 2018-09-27 DIAGNOSIS — Z20828 Contact with and (suspected) exposure to other viral communicable diseases: Secondary | ICD-10-CM | POA: Insufficient documentation

## 2018-09-27 DIAGNOSIS — J40 Bronchitis, not specified as acute or chronic: Secondary | ICD-10-CM | POA: Insufficient documentation

## 2018-09-27 DIAGNOSIS — I10 Essential (primary) hypertension: Secondary | ICD-10-CM | POA: Insufficient documentation

## 2018-09-27 DIAGNOSIS — M25511 Pain in right shoulder: Secondary | ICD-10-CM | POA: Insufficient documentation

## 2018-09-27 DIAGNOSIS — Z79899 Other long term (current) drug therapy: Secondary | ICD-10-CM | POA: Insufficient documentation

## 2018-09-27 LAB — SARS CORONAVIRUS 2 BY RT PCR (HOSPITAL ORDER, PERFORMED IN ~~LOC~~ HOSPITAL LAB): SARS Coronavirus 2: NEGATIVE

## 2018-09-27 LAB — CBC WITH DIFFERENTIAL/PLATELET
Abs Immature Granulocytes: 0.04 10*3/uL (ref 0.00–0.07)
Basophils Absolute: 0.1 10*3/uL (ref 0.0–0.1)
Basophils Relative: 1 %
Eosinophils Absolute: 0.2 10*3/uL (ref 0.0–0.5)
Eosinophils Relative: 1 %
HCT: 39.4 % (ref 36.0–46.0)
Hemoglobin: 12.9 g/dL (ref 12.0–15.0)
Immature Granulocytes: 0 %
Lymphocytes Relative: 31 %
Lymphs Abs: 3.2 10*3/uL (ref 0.7–4.0)
MCH: 28.8 pg (ref 26.0–34.0)
MCHC: 32.7 g/dL (ref 30.0–36.0)
MCV: 87.9 fL (ref 80.0–100.0)
Monocytes Absolute: 0.8 10*3/uL (ref 0.1–1.0)
Monocytes Relative: 7 %
Neutro Abs: 6.3 10*3/uL (ref 1.7–7.7)
Neutrophils Relative %: 60 %
Platelets: 258 10*3/uL (ref 150–400)
RBC: 4.48 MIL/uL (ref 3.87–5.11)
RDW: 13 % (ref 11.5–15.5)
WBC: 10.5 10*3/uL (ref 4.0–10.5)
nRBC: 0 % (ref 0.0–0.2)

## 2018-09-27 LAB — COMPREHENSIVE METABOLIC PANEL
ALT: 29 U/L (ref 0–44)
AST: 21 U/L (ref 15–41)
Albumin: 3.6 g/dL (ref 3.5–5.0)
Alkaline Phosphatase: 154 U/L — ABNORMAL HIGH (ref 38–126)
Anion gap: 10 (ref 5–15)
BUN: 22 mg/dL — ABNORMAL HIGH (ref 6–20)
CO2: 25 mmol/L (ref 22–32)
Calcium: 8.7 mg/dL — ABNORMAL LOW (ref 8.9–10.3)
Chloride: 101 mmol/L (ref 98–111)
Creatinine, Ser: 0.68 mg/dL (ref 0.44–1.00)
GFR calc Af Amer: >60 mL/min (ref >60)
GFR calc non Af Amer: >60 mL/min (ref >60)
Glucose, Bld: 351 mg/dL — ABNORMAL HIGH (ref 70–99)
Potassium: 4.2 mmol/L (ref 3.5–5.1)
Sodium: 136 mmol/L (ref 135–145)
Total Bilirubin: 0.6 mg/dL (ref 0.3–1.2)
Total Protein: 6.9 g/dL (ref 6.5–8.1)

## 2018-09-27 LAB — BRAIN NATRIURETIC PEPTIDE: B Natriuretic Peptide: 59 pg/mL (ref 0.0–100.0)

## 2018-09-27 LAB — TROPONIN I: Troponin I: <0.03 ng/mL (ref <0.03)

## 2018-09-27 MED ORDER — KETOROLAC TROMETHAMINE 30 MG/ML IJ SOLN
30.0000 mg | Freq: Once | INTRAMUSCULAR | Status: AC
  Administered 2018-09-27: 30 mg via INTRAMUSCULAR
  Filled 2018-09-27: qty 1

## 2018-09-27 MED ORDER — ALBUTEROL SULFATE HFA 108 (90 BASE) MCG/ACT IN AERS: 2.0000 | INHALATION_SPRAY | Freq: Four times a day (QID) | RESPIRATORY_TRACT | 2 refills | Status: AC | PRN

## 2018-09-27 MED ORDER — AZITHROMYCIN 250 MG PO TABS: ORAL_TABLET | ORAL | 0 refills | Status: AC

## 2018-09-27 NOTE — ED Provider Notes (Signed)
[facility] Emergency Department Provider Note   ____________________________________________   First MD Initiated Contact with Patient 09/27/18 1646     (approximate)  I have reviewed the triage vital signs and the nursing notes.   HISTORY  Chief Complaint Cough   HPI [name] is a 53 y.o. female smoker who comes in with a cough productive of brown phlegm.  She is been going on for couple days.  She has had pneumonia before is afraid she has bronchitis this time.  She does not have a fever.  She also says she hurt her shoulder at work days ago and it hurts to touch it and move it especially below the acromion laterally.  She is also complaining of a headache sinus-like pain for the last couple of days.         Past Medical History:  Diagnosis Date   Diabetes mellitus without complication (HCC)    Hypertension     Patient Active Problem List   Diagnosis Date Noted   Cellulitis 04/22/2018    Past Surgical History:  Procedure Laterality Date   ABDOMINAL HYSTERECTOMY     APPENDECTOMY     CHOLECYSTECTOMY     NASAL SEPTUM SURGERY     PARTIAL HYSTERECTOMY     TONSILLECTOMY      Prior to Admission medications   Medication Sig Start Date End Date Taking? Authorizing Provider  albuterol (VENTOLIN HFA) 108 (90 Base) MCG/ACT inhaler Inhale 2 puffs into the lungs every 6 (six) hours as needed for wheezing or shortness of breath. 09/27/18   Arnaldo NatalMalinda, Vasilios Ottaway F, MD  azithromycin (ZITHROMAX Z-PAK) 250 MG tablet Take 2 tablets (500 mg) on  Day 1,  followed by 1 tablet (250 mg) once daily on Days 2 through 5. 09/27/18 10/02/18  Arnaldo NatalMalinda, Mckyle Solanki F, MD  furosemide (LASIX) 20 MG tablet Take 1 tablet (20 mg total) by mouth daily as needed for edema. 04/24/18 04/24/19  Houston SirenSainani, Vivek J, MD  insulin glargine (LANTUS) 100 UNIT/ML injection Please take 20 units SQ every night. 04/25/18 04/25/19  Merrily Brittleifenbark, Neil, MD  insulin NPH-regular Human (NOVOLIN 70/30) (70-30) 100  UNIT/ML injection Inject 15 Units into the skin 2 (two) times daily with a meal. 04/24/18 06/23/18  Sainani, Rolly PancakeVivek J, MD  lisinopril (PRINIVIL,ZESTRIL) 10 MG tablet Take 1 tablet (10 mg total) by mouth daily. 09/18/17 09/18/18  Willy Eddyobinson, Patrick, MD  oxyCODONE (ROXICODONE) 5 MG immediate release tablet Take 1 tablet (5 mg total) by mouth every 8 (eight) hours as needed. 04/25/18 04/25/19  Merrily Brittleifenbark, Neil, MD  traMADol (ULTRAM) 50 MG tablet Take 1 tablet (50 mg total) by mouth every 6 (six) hours as needed. 08/19/16   Tommi RumpsSummers, Rhonda L, PA-C    Allergies Codeine and Tylenol [acetaminophen]  Family History  Problem Relation Age of Onset   Kidney disease Mother    Hypertension Mother    Transient ischemic attack Father    CAD Father     Social History Social History   Tobacco Use   Smoking status: Current Every Day Smoker    Packs/day: 1.00    Types: Cigarettes   Smokeless tobacco: Never Used  Substance Use Topics   Alcohol use: No   Drug use: No    Review of Systems  Constitutional: No fever/chills Eyes: No visual changes. ENT: No sore throat. Cardiovascular: Denies chest pain. Respiratory: Denies shortness of breath. Gastrointestinal: No abdominal pain.  No nausea, no vomiting.  No diarrhea.  No constipation. Genitourinary: Negative for  dysuria. Musculoskeletal: Negative for back pain. Skin: Negative for rash. Neurological: Negative for headaches, focal weakness   ____________________________________________   PHYSICAL EXAM:  VITAL SIGNS: ED Triage Vitals  Enc Vitals Group     BP 09/27/18 1535 110/60     Pulse Rate 09/27/18 1535 92     Resp 09/27/18 1535 16     Temp 09/27/18 1535 98.8 F (37.1 C)     Temp Source 09/27/18 1535 Oral     SpO2 09/27/18 1535 97 %     Weight 09/27/18 1536 210 lb (95.3 kg)     Height 09/27/18 1536 5' 6.5" (1.689 m)     Head Circumference --      Peak Flow --      Pain Score 09/27/18 1535 6     Pain Loc --      Pain Edu? --       Excl. in GC? --     Constitutional: Alert and oriented. Well appearing and in no acute distress. Eyes: Conjunctivae are normal.  Head: Atraumatic. Nose: No congestion/rhinnorhea. Mouth/Throat: Mucous membranes are moist.  Oropharynx non-erythematous. Neck: No stridor.  Cardiovascular: Normal rate, regular rhythm. Grossly normal heart sounds.  Good peripheral circulation. Respiratory: Normal respiratory effort.  No retractions. Lungs CTAB. Gastrointestinal: Soft and nontender. No distention. No abdominal bruits. No CVA tenderness. Musculoskeletal: No lower extremity tenderness nor edema.  Right shoulder is tender to palpation at the point of the shoulder under the acromion.  Hurts to move it as well.  No obvious swelling or redness Neurologic:  Normal speech and language. No gross focal neurologic deficits are appreciated.  Skin:  Skin is warm, dry and intact. No rash noted.   ____________________________________________   LABS (all labs ordered are listed, but only abnormal results are displayed)  Labs Reviewed  COMPREHENSIVE METABOLIC PANEL - Abnormal; Notable for the following components:      Result Value   Glucose, Bld 351 (*)    BUN 22 (*)    Calcium 8.7 (*)    Alkaline Phosphatase 154 (*)    All other components within normal limits  SARS CORONAVIRUS 2 (HOSPITAL ORDER, PERFORMED IN [facility])  CBC WITH DIFFERENTIAL/PLATELET  BRAIN NATRIURETIC PEPTIDE  TROPONIN I   ____________________________________________  EKG   ____________________________________________  RADIOLOGY  ED MD interpretation: Chest x-ray read by radiology reviewed by me shows what could be pulmonary vascular congestion.  Official radiology report(s): Dg Shoulder Right  Result Date: 09/27/2018 CLINICAL DATA:  Right shoulder pain EXAM: RIGHT SHOULDER - 2+ VIEW COMPARISON:  None. FINDINGS: There is no evidence of fracture or dislocation. There is no evidence of arthropathy or other  focal bone abnormality. Soft tissues are unremarkable. IMPRESSION: Negative. Electronically Signed   By: Marnee SpringJonathon  Watts M.D.   On: 09/27/2018 17:52   Dg Chest Portable 1 View  Result Date: 09/27/2018 CLINICAL DATA:  Cough EXAM: PORTABLE CHEST 1 VIEW COMPARISON:  April 22, 2018 and Sep 17, 2015 FINDINGS: There is a stable nodular opacity measuring 7 mm in the left apex. There is no appreciable edema or consolidation. Heart is borderline prominent with mild pulmonary venous hypertension. No adenopathy. No bone lesions. IMPRESSION: Stable probable granuloma in the left apex. There is a degree of pulmonary vascular congestion without edema or consolidation. No demonstrable adenopathy. Electronically Signed   By: Bretta BangWilliam  Woodruff III M.D.   On: 09/27/2018 17:07    ____________________________________________   PROCEDURES  Procedure(s) performed (including Critical Care):  Procedures  ____________________________________________   INITIAL IMPRESSION / ASSESSMENT AND PLAN / ED COURSE  Patient doing well.  She does appear to have bronchitis.  We will give her some Zithromax.  She has had bronchitis repeatedly in the past and occasionally progresses to pneumonia.  We want to try to avoid this.  Her coronavirus test is negative.              ____________________________________________   FINAL CLINICAL IMPRESSION(S) / ED DIAGNOSES  Final diagnoses:  Bronchitis     ED Discharge Orders         Ordered    azithromycin (ZITHROMAX Z-PAK) 250 MG tablet    Note to Pharmacy:  For bronchitis   09/27/18 1951    albuterol (VENTOLIN HFA) 108 (90 Base) MCG/ACT inhaler  Every 6 hours PRN     09/27/18 1951           Note:  This document was prepared using Dragon voice recognition software and may include unintentional dictation errors.    Nena Polio, MD 09/27/18 [phone]

## 2018-09-27 NOTE — ED Triage Notes (Signed)
Patient c/o soreness in chest when she coughs. Reports she always has a cough but experiencing experience with it now. Patient is concerned she has bronchitis. Patient just got back from [place] today

## 2018-09-27 NOTE — Discharge Instructions (Addendum)
Your chest x-ray does not show pneumonia and your coronavirus test is negative.  Please use the Zithromax Z-PAK antibiotic and the albuterol inhaler 2 puffs 4 times a day to help with the cough.  Please return here for fever shortness of breath or feeling sicker.

## 2018-09-27 NOTE — ED Notes (Signed)
Patient AAOx4. Vitals stable. NAD. 

## 2020-01-02 IMAGING — DX PORTABLE CHEST - 1 VIEW
1 series · 1 of 1 positions shown · non-contrast
Comparison: April 22, 2018 and September 17, 2015

CLINICAL DATA: Cough

EXAM:
PORTABLE CHEST 1 VIEW

[chest ap]
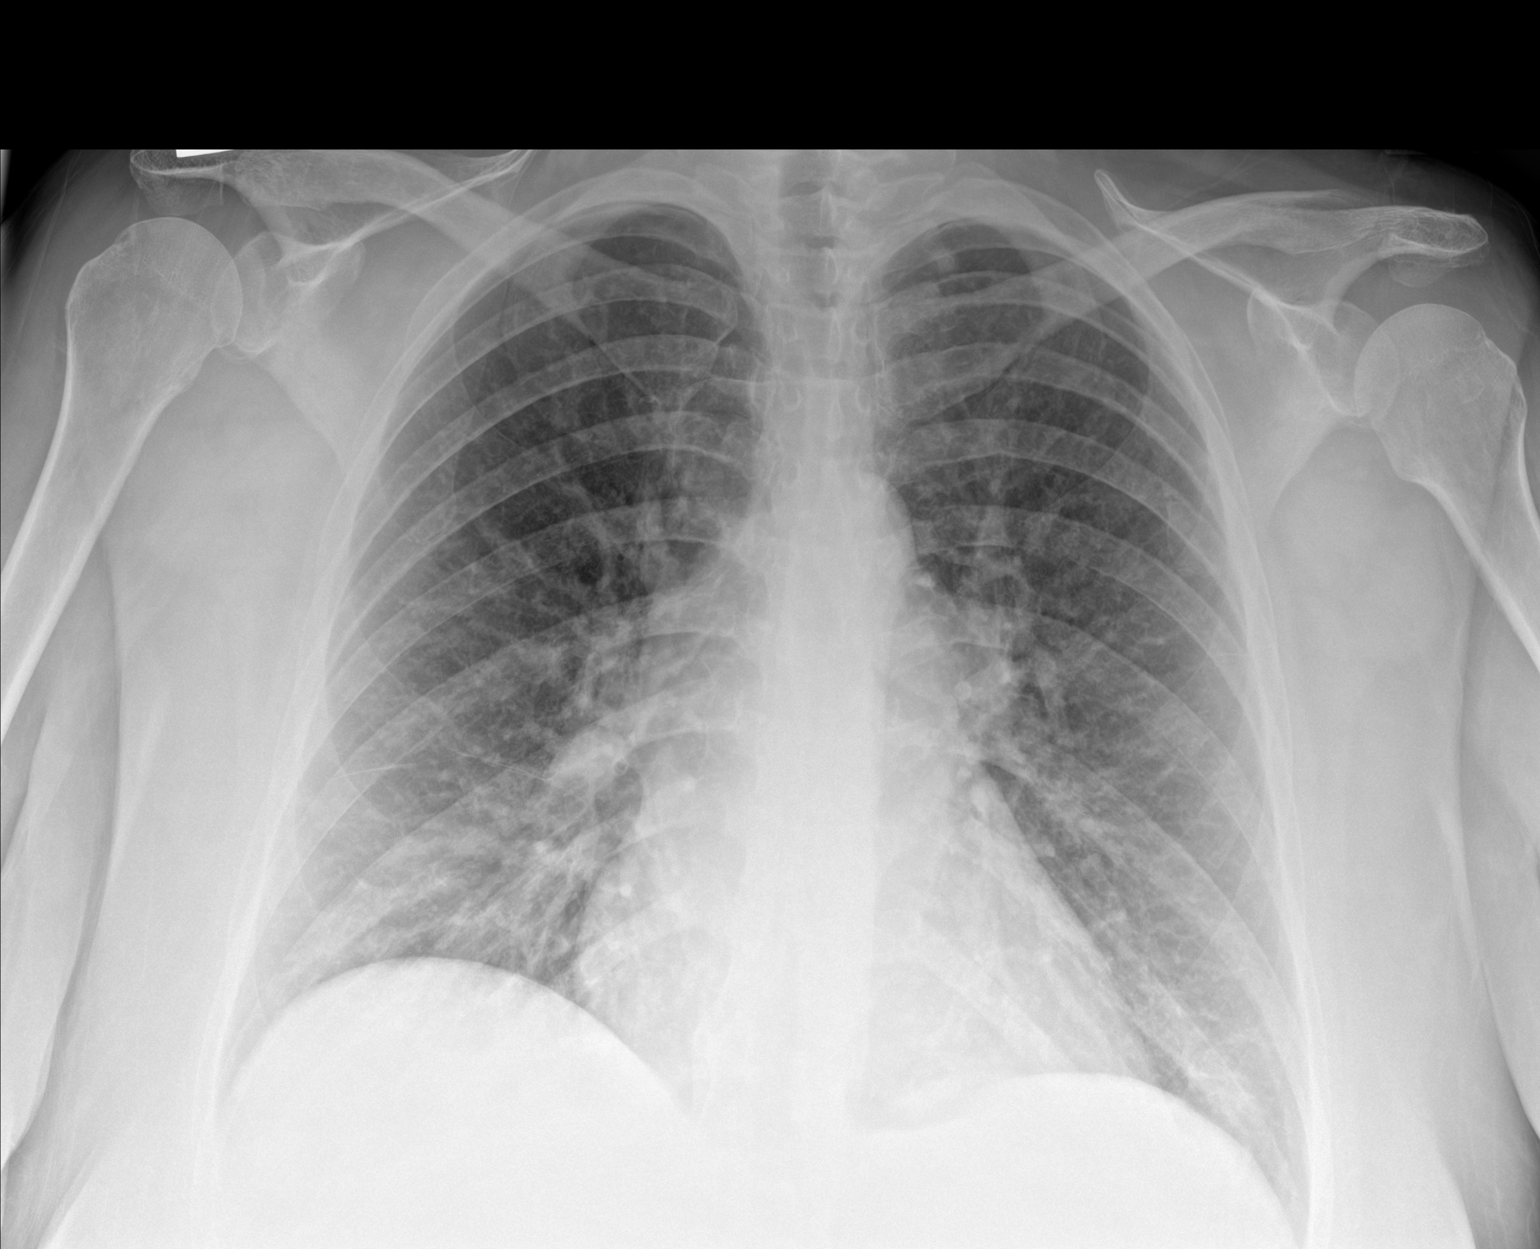

[1 of 1 positions shown; findings below may reference images not displayed]

FINDINGS: There is a stable nodular opacity measuring 7 mm in the left apex.
There is no appreciable edema or consolidation. Heart is borderline
prominent with mild pulmonary venous hypertension. No adenopathy. No
bone lesions.
IMPRESSION: Stable probable granuloma in the left apex. There is a degree of
pulmonary vascular congestion without edema or consolidation. No
demonstrable adenopathy.

## 2020-01-02 IMAGING — DX RIGHT SHOULDER - 2+ VIEW
3 series · 3 of 3 positions shown · non-contrast
Comparison: None.

CLINICAL DATA: Right shoulder pain

EXAM:
RIGHT SHOULDER - 2+ VIEW

[shoulder obl (1 of 2)]
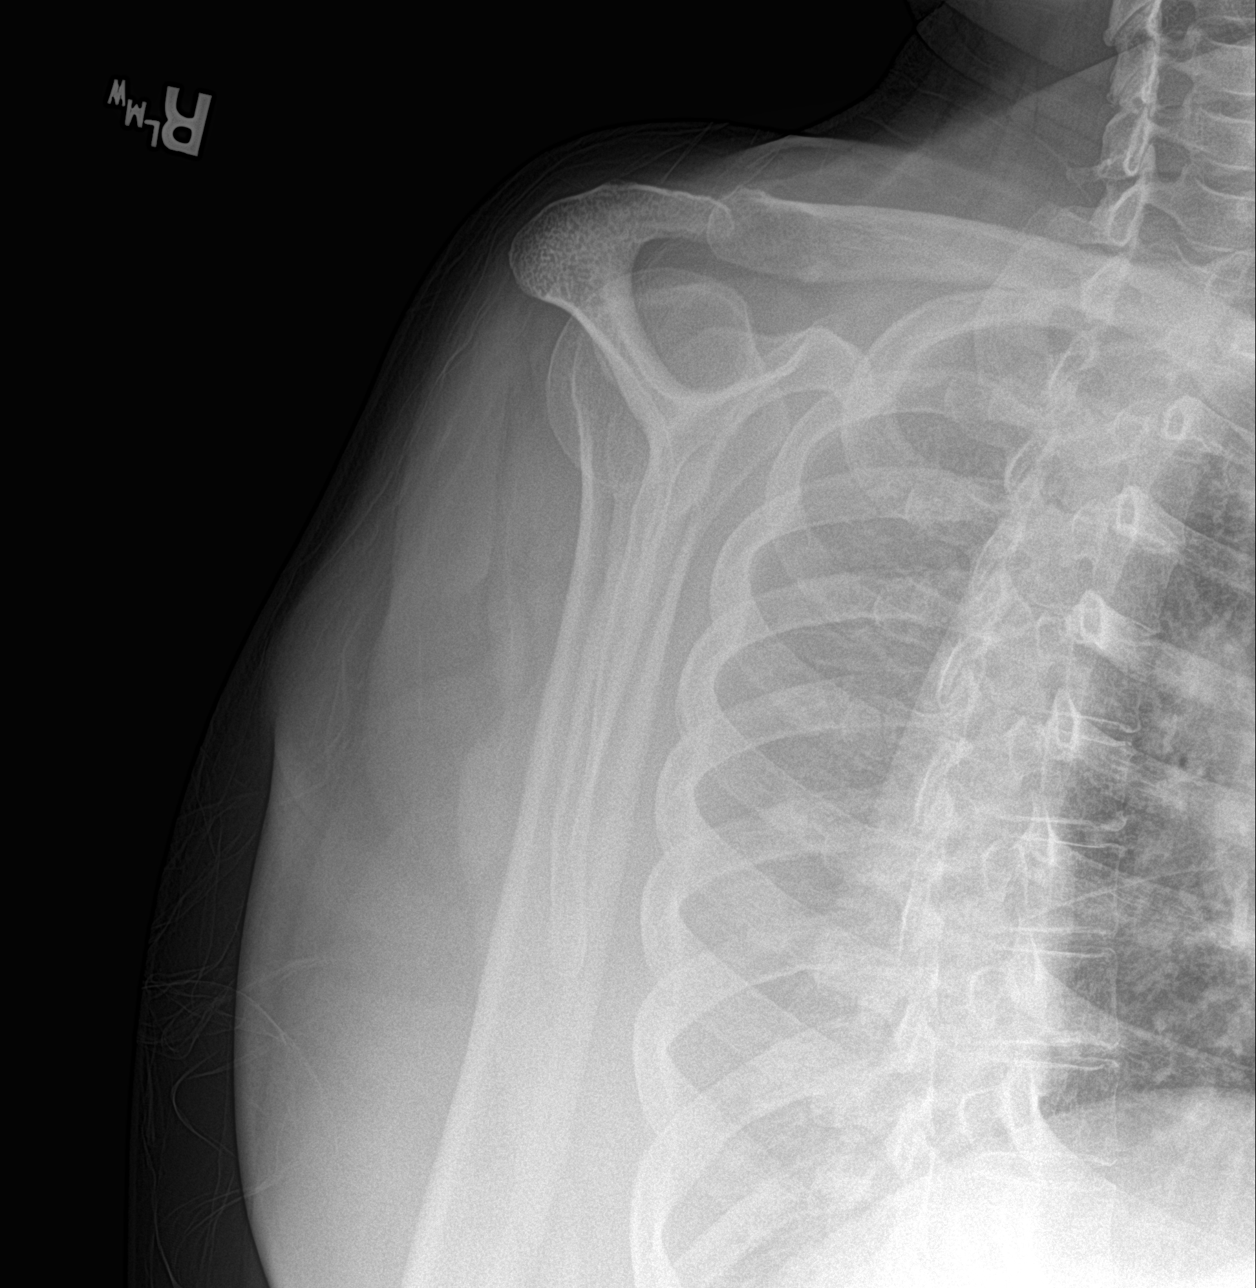

[shoulder ap]
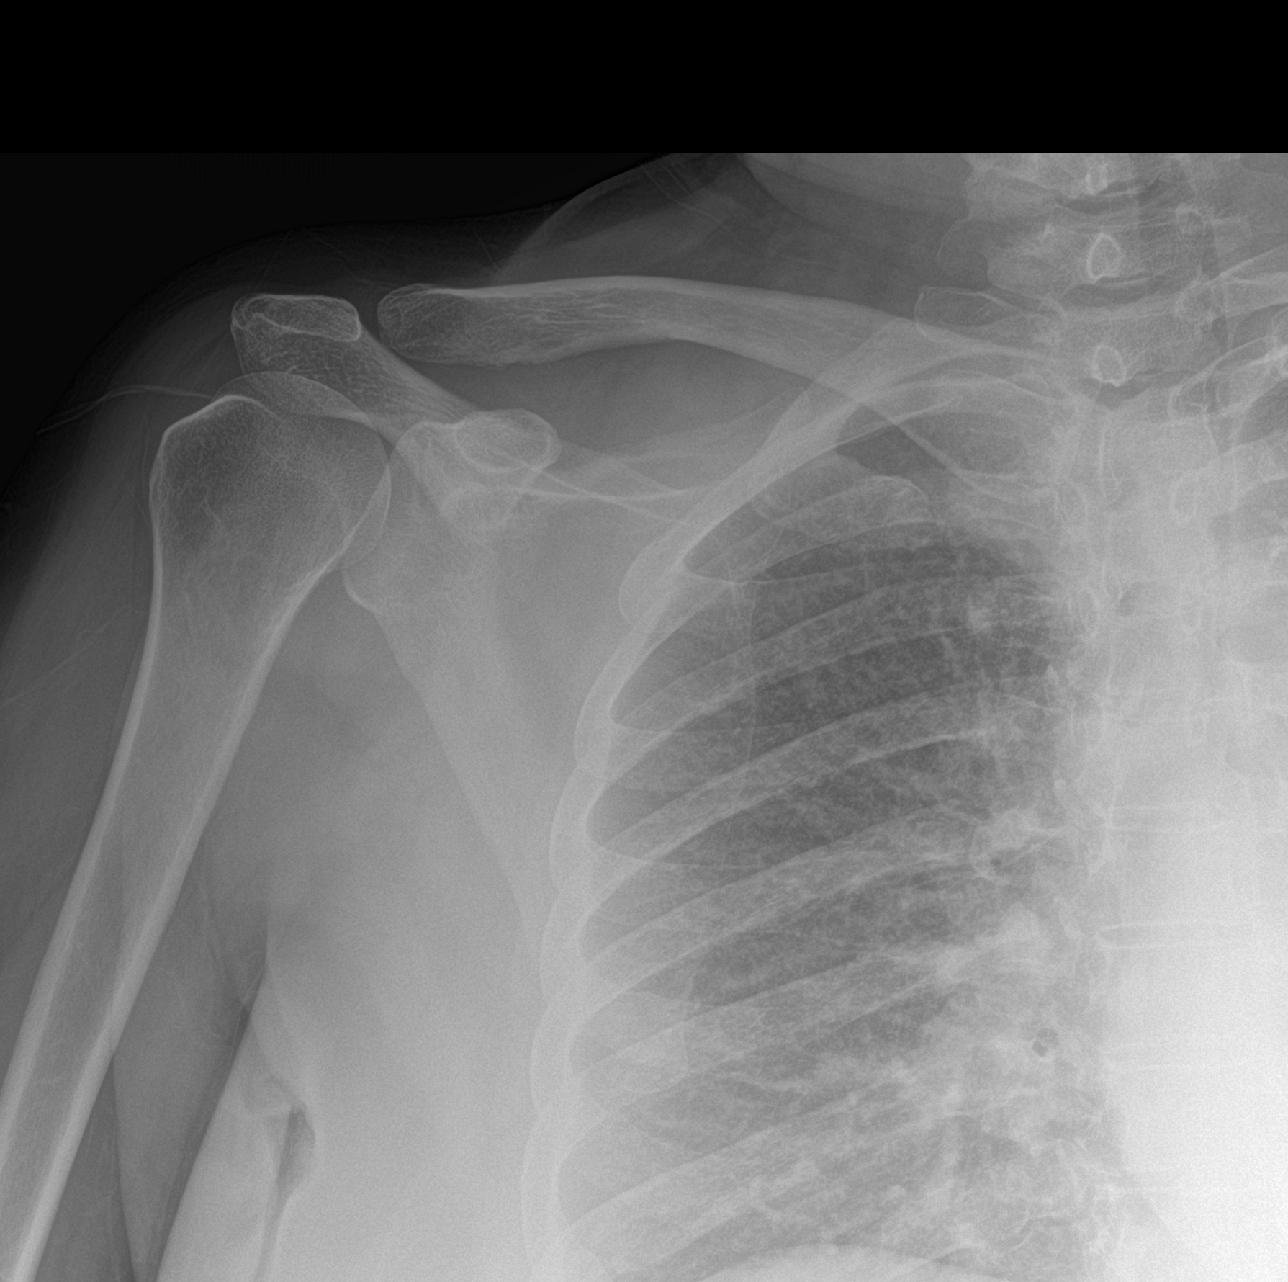

[shoulder obl (2 of 2)]
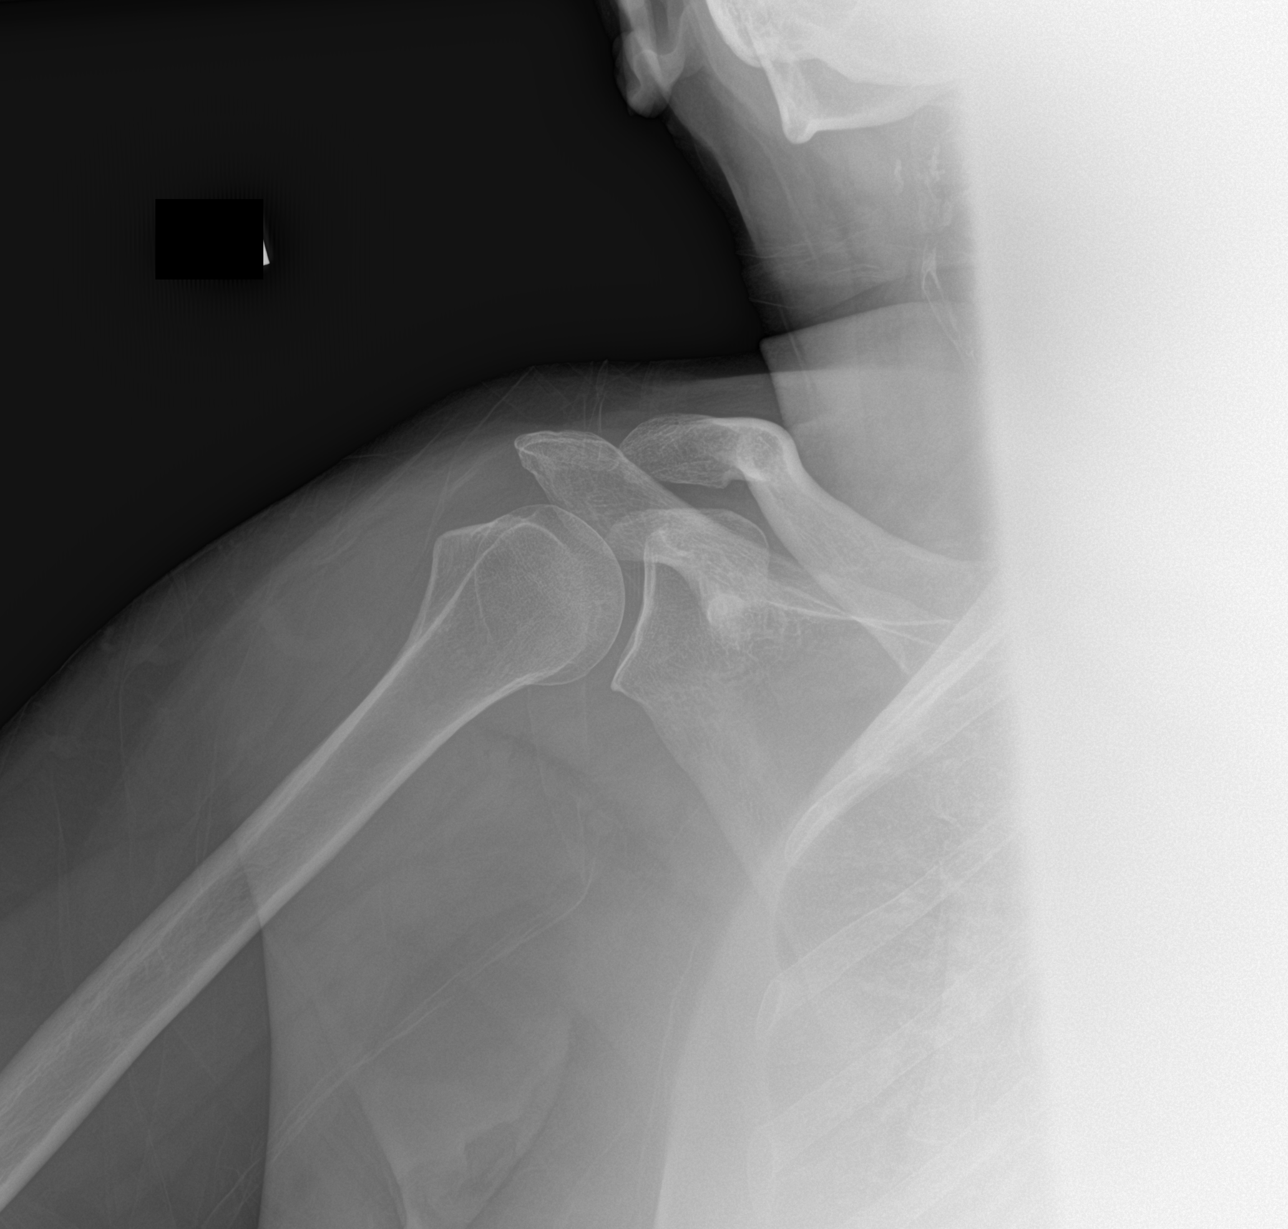

[3 of 3 positions shown; findings below may reference images not displayed]

FINDINGS: There is no evidence of fracture or dislocation. There is no
evidence of arthropathy or other focal bone abnormality. Soft
tissues are unremarkable.
IMPRESSION: Negative.

## 2023-04-23 DEATH — deceased
